# Patient Record
Sex: Female | Born: 1995 | Race: White | Hispanic: No | Marital: Single | Smoking: Never smoker
Health system: Southern US, Community
[De-identification: ages and names within clinical notes are randomized; demographics above are authoritative.]

## PROBLEM LIST (undated history)

## (undated) DIAGNOSIS — A6000 Herpesviral infection of urogenital system, unspecified: Secondary | ICD-10-CM

## (undated) DIAGNOSIS — L509 Urticaria, unspecified: Secondary | ICD-10-CM

## (undated) HISTORY — PX: OTHER SURGICAL HISTORY: SHX169

## (undated) HISTORY — DX: Herpesviral infection of urogenital system, unspecified: A60.00

## (undated) HISTORY — DX: Urticaria, unspecified: L50.9

## (undated) HISTORY — PX: TONSILLECTOMY AND ADENOIDECTOMY: SUR1326

## (undated) HISTORY — PX: FOOT SURGERY: SHX648

---
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
## 2015-04-02 ENCOUNTER — Encounter: Payer: Self-pay | Admitting: Primary Care

## 2015-04-02 ENCOUNTER — Ambulatory Visit (INDEPENDENT_AMBULATORY_CARE_PROVIDER_SITE_OTHER): Payer: 59 | Admitting: Primary Care

## 2015-04-02 VITALS — BP 114/66 | HR 127 | Temp 100.1°F | Ht 62.75 in | Wt 125.8 lb

## 2015-04-02 DIAGNOSIS — R509 Fever, unspecified: Secondary | ICD-10-CM

## 2015-04-02 LAB — CBC WITH DIFFERENTIAL/PLATELET
BASOS PCT: 0.3 % (ref 0.0–3.0)
Basophils Absolute: 0 10*3/uL (ref 0.0–0.1)
EOS ABS: 0 10*3/uL (ref 0.0–0.7)
Eosinophils Relative: 0.2 % (ref 0.0–5.0)
HCT: 38.2 % (ref 36.0–49.0)
Hemoglobin: 12.8 g/dL (ref 12.0–16.0)
LYMPHS ABS: 1.5 10*3/uL (ref 0.7–4.0)
Lymphocytes Relative: 14.4 % — ABNORMAL LOW (ref 24.0–48.0)
MCHC: 33.4 g/dL (ref 31.0–37.0)
MCV: 85.4 fl (ref 78.0–98.0)
MONO ABS: 1 10*3/uL (ref 0.1–1.0)
Monocytes Relative: 10.1 % (ref 3.0–12.0)
NEUTROS ABS: 7.7 10*3/uL (ref 1.4–7.7)
Neutrophils Relative %: 75 % — ABNORMAL HIGH (ref 43.0–71.0)
PLATELETS: 185 10*3/uL (ref 150.0–575.0)
RBC: 4.47 Mil/uL (ref 3.80–5.70)
RDW: 13 % (ref 11.4–15.5)
WBC: 10.2 10*3/uL (ref 4.5–13.5)

## 2015-04-02 LAB — COMPREHENSIVE METABOLIC PANEL
ALT: 14 U/L (ref 0–35)
AST: 17 U/L (ref 0–37)
Albumin: 3.9 g/dL (ref 3.5–5.2)
Alkaline Phosphatase: 63 U/L (ref 47–119)
BUN: 9 mg/dL (ref 6–23)
CHLORIDE: 103 meq/L (ref 96–112)
CO2: 24 mEq/L (ref 19–32)
CREATININE: 0.61 mg/dL (ref 0.40–1.20)
Calcium: 8.9 mg/dL (ref 8.4–10.5)
GFR: 133.45 mL/min (ref >60.00)
Glucose, Bld: 95 mg/dL (ref 70–99)
Potassium: 4.1 mEq/L (ref 3.5–5.1)
SODIUM: 134 meq/L — AB (ref 135–145)
Total Bilirubin: 0.3 mg/dL (ref 0.2–1.2)
Total Protein: 6.7 g/dL (ref 6.0–8.3)

## 2015-04-02 LAB — POCT RAPID STREP A (OFFICE): RAPID STREP A SCREEN: NEGATIVE

## 2015-04-02 LAB — MONONUCLEOSIS SCREEN: Mono Screen: NEGATIVE

## 2015-04-02 LAB — POCT INFLUENZA A/B
INFLUENZA B, POC: NEGATIVE
Influenza A, POC: NEGATIVE

## 2015-04-02 NOTE — Patient Instructions (Signed)
Your flu and strep tests were negative.  Complete lab work prior to leaving today. I will notify you of your results once received.   It's important for you to stay hydrated with water. Drink 8 bottles of water daily, or 64 ounces.   Continue tylenol or ibuprofen for fevers and body aches. Do not exceed 3000 mg of tylenol or 2400 mg of ibuprofen in 24 hours.  Please call me if you develop fevers over 101, coughing up green mucous, vomiting/diarrhea.  I will be in touch with you soon.  It was a pleasure meeting you!

## 2015-04-02 NOTE — Progress Notes (Signed)
Subjective:    Patient ID: Jaclyn Bell, female    DOB: 1995/08/12, 20 y.o.   MRN: [id number]  HPI  Jaclyn Bell is a 20 year old female who presents today with a chief complaint of sore throat. She also reports fevers, chills, night sweats, headaches, body aches. She has a fever of 100.1 in the clinic today with a heart rate of 127. Her symptoms have been present since Monday this week, with worsening symptoms yesterday. She endorses having a flu shot this season. She's taken Aleve cold and sinus with temporary improvement in her sleep. She's been around her boyfriend who had strep throat 2 weeks ago.   Review of Systems  Constitutional: Positive for fever, chills and fatigue.  HENT: Positive for sore throat.   Respiratory: Negative for cough and shortness of breath.   Cardiovascular: Negative for chest pain.  Gastrointestinal: Negative for nausea, vomiting and diarrhea.  Musculoskeletal: Positive for myalgias.  Neurological: Positive for headaches.       No past medical history on file.  Social History   Social History  . Marital Status: Single    Spouse Name: N/A  . Number of Children: N/A  . Years of Education: N/A   Occupational History  . Not on file.   Social History Main Topics  . Smoking status: Never Smoker   . Smokeless tobacco: Never Used  . Alcohol Use: No  . Drug Use: No  . Sexual Activity: Not on file   Other Topics Concern  . Not on file   Social History Narrative   Working for [employer] and [employer]    Past Surgical History  Procedure Laterality Date  . Tonsillectomy and adenoidectomy    . Tubes in ears Bilateral     No family history on file.  No Known Allergies  Current Outpatient Prescriptions on File Prior to Visit  Medication Sig Dispense Refill  . Norgestimate-Ethinyl Estradiol Triphasic 0.18/0.215/0.25 MG-35 MCG tablet Take 1 tablet by mouth daily. 28 tablet 11   No current facility-administered medications on file prior  to visit.    BP 114/66 mmHg  Pulse 127  Temp(Src) 100.1 F (37.8 C) (Oral)  Ht 5' 2.75" (1.594 m)  Wt 125 lb 12.8 oz (57.063 kg)  BMI 22.46 kg/m2  SpO2 96%  LMP 03/11/2015    Objective:   Physical Exam  Constitutional: She appears ill.  HENT:  Right Ear: Tympanic membrane and ear canal normal.  Left Ear: Tympanic membrane and ear canal normal.  Nose: Right sinus exhibits maxillary sinus tenderness. Right sinus exhibits no frontal sinus tenderness. Left sinus exhibits maxillary sinus tenderness. Left sinus exhibits no frontal sinus tenderness.  Mouth/Throat: Oropharynx is clear and moist. No posterior oropharyngeal edema or posterior oropharyngeal erythema.  Eyes: Conjunctivae are normal.  Cardiovascular: Regular rhythm.   Sinus tachycardia  Pulmonary/Chest: Effort normal and breath sounds normal. She has no wheezes. She has no rales.  Lymphadenopathy:    She has cervical adenopathy.  Skin: Skin is warm and dry.          Assessment & Plan:  Flu-Like Symptoms:  Fever, chills, body aches, headache since Monday, significantly worse Tuesday.  No N/V/D. Minor cough. Exam with clear lungs, patient appears ill, fever of 100.0 with sinus tachycardia. Rapid Flu: Negative Rapid Strep: Negative HPI and examination suggest viral involvement so will given age, will check labs including mono, CBC, CMP. Fluids, tylenol/ibuprofen, rest. Will be in touch with her later today. ED/retrun precautions  provided.

## 2015-04-02 NOTE — Progress Notes (Signed)
Pre visit review using our clinic review tool, if applicable. No additional management support is needed unless otherwise documented below in the visit note. 

## 2015-04-02 NOTE — Addendum Note (Signed)
Addended by: Jacqualin Combes on: 04/02/2015 01:03 PM   Modules accepted: Orders, SmartSet

## 2015-04-04 ENCOUNTER — Telehealth: Payer: Self-pay | Admitting: Primary Care

## 2015-04-04 NOTE — Telephone Encounter (Signed)
Called patient. She stated that she is feeling better. No fever today. Throat is a little sore and ears are popping when swallowing. There is some neck pain but that can be due to the way she has been sleeping.

## 2015-04-04 NOTE — Telephone Encounter (Signed)
Will you please call and check on Jaclyn Bell? How's she feeling?

## 2015-04-04 NOTE — Telephone Encounter (Signed)
Noted. Thanks.

## 2015-05-15 ENCOUNTER — Telehealth: Payer: Self-pay | Admitting: *Deleted

## 2015-05-15 NOTE — Telephone Encounter (Signed)
Please review her immunizations in [facility].

## 2015-05-15 NOTE — Telephone Encounter (Signed)
Spoke to pt and informed her immunization record is available for pickup from the front desk. TB skin test scheduled and pt advised she may receive documentation after having it read

## 2015-05-15 NOTE — Telephone Encounter (Signed)
Pt left voicemail at Triage. Pt is entering in the dental assistant program at [employer] and she needs proof that her vaccines are up to date or if there is one she needs to get. Pt either needs a copy of her immunizations if she is up to date and if not she needs to schedule a visit to get her immunizations up to date

## 2015-05-19 ENCOUNTER — Ambulatory Visit (INDEPENDENT_AMBULATORY_CARE_PROVIDER_SITE_OTHER): Payer: 59 | Admitting: *Deleted

## 2015-05-19 DIAGNOSIS — Z23 Encounter for immunization: Secondary | ICD-10-CM

## 2015-05-19 MED ORDER — TUBERCULIN PPD 5 UNIT/0.1ML ID SOLN: 5.0000 [IU] | Freq: Once | INTRADERMAL | Status: DC

## 2015-05-20 ENCOUNTER — Ambulatory Visit: Payer: 59

## 2015-05-21 LAB — TB SKIN TEST
Induration: 0 mm
TB SKIN TEST: NEGATIVE

## 2015-11-18 ENCOUNTER — Other Ambulatory Visit: Payer: Self-pay | Admitting: Family Medicine

## 2015-11-20 ENCOUNTER — Telehealth: Payer: Self-pay | Admitting: Family Medicine

## 2015-11-20 NOTE — Telephone Encounter (Signed)
We will be in clinic on Thursday. Jaclyn Bell may be able to give it, but if not she may have to come when the flu clinic is open

## 2015-11-20 NOTE — Telephone Encounter (Signed)
Pt called to get flu shot next week would like thurs  Can I put her in

## 2015-11-21 NOTE — Telephone Encounter (Signed)
Pt made appointment to see dr Deborra Medina 10/12

## 2015-11-21 NOTE — Telephone Encounter (Signed)
I will be on vacation next week; however, we do have flu clinics opening up next week all day on Tuesday and Friday, if she could make either of those days.  Thanks Shirlean Mylar for following up with her on this.

## 2015-11-27 ENCOUNTER — Encounter (INDEPENDENT_AMBULATORY_CARE_PROVIDER_SITE_OTHER): Payer: Self-pay

## 2015-11-27 ENCOUNTER — Ambulatory Visit: Payer: 59 | Admitting: Family Medicine

## 2015-11-27 ENCOUNTER — Ambulatory Visit (INDEPENDENT_AMBULATORY_CARE_PROVIDER_SITE_OTHER): Payer: 59

## 2015-11-27 DIAGNOSIS — Z309 Encounter for contraceptive management, unspecified: Secondary | ICD-10-CM | POA: Insufficient documentation

## 2015-11-27 DIAGNOSIS — Z23 Encounter for immunization: Secondary | ICD-10-CM

## 2015-12-09 ENCOUNTER — Ambulatory Visit (INDEPENDENT_AMBULATORY_CARE_PROVIDER_SITE_OTHER): Payer: 59 | Admitting: Family Medicine

## 2015-12-09 ENCOUNTER — Encounter: Payer: Self-pay | Admitting: Family Medicine

## 2015-12-09 VITALS — BP 102/70 | HR 79 | Temp 98.0°F | Ht 63.0 in | Wt 130.5 lb

## 2015-12-09 DIAGNOSIS — Z01419 Encounter for gynecological examination (general) (routine) without abnormal findings: Secondary | ICD-10-CM | POA: Diagnosis not present

## 2015-12-09 DIAGNOSIS — Z309 Encounter for contraceptive management, unspecified: Secondary | ICD-10-CM | POA: Diagnosis not present

## 2015-12-09 MED ORDER — DROSPIRENONE-ETHINYL ESTRADIOL 3-0.02 MG PO TABS: 1.0000 | ORAL_TABLET | Freq: Every day | ORAL | 11 refills | Status: DC

## 2015-12-09 NOTE — Progress Notes (Signed)
Subjective:   Patient ID: Jaclyn Bell, female    DOB: 1996-01-08, 20 y.o.   MRN: [id number]  Jaclyn Bell is a pleasant 20 y.o. year old female who presents to clinic today with Annual Exam and Contraception (wanting to change pill type)  on 12/09/2015  HPI:   On OCPs.  She would like to discuss other pills.  Having more mood swings, periods are heavy again.  Previously tried loestrin.  Currently taking Tri sprintec.  Acne improved as well.  Not sexually active.  Lab Results  Component Value Date   WBC 10.2 04/02/2015   HGB 12.8 04/02/2015   HCT 38.2 04/02/2015   MCV 85.4 04/02/2015   PLT 185.0 04/02/2015   Lab Results  Component Value Date   NA 134 (L) 04/02/2015   K 4.1 04/02/2015   CL 103 04/02/2015   CO2 24 04/02/2015   Lab Results  Component Value Date   CHOL 142 11/22/2012   HDL 50 11/22/2012   LDLCALC 83 11/22/2012   TRIG 47 11/22/2012   Lab Results  Component Value Date   CREATININE 0.61 04/02/2015    No current outpatient prescriptions on file prior to visit.   No current facility-administered medications on file prior to visit.     No Known Allergies  No past medical history on file.  Past Surgical History:  Procedure Laterality Date  . TONSILLECTOMY AND ADENOIDECTOMY    . TUBES IN EARS Bilateral     No family history on file.  Social History   Social History  . Marital status: Single    Spouse name: N/A  . Number of children: N/A  . Years of education: N/A   Occupational History  . Not on file.   Social History Main Topics  . Smoking status: Never Smoker  . Smokeless tobacco: Never Used  . Alcohol use No  . Drug use: No  . Sexual activity: Not on file   Other Topics Concern  . Not on file   Social History Narrative   Working for [employer] and [employer]   The PMH, PSH, Social History, Family History, Medications, and allergies have been reviewed in [facility], and have been updated if relevant.   Review of  Systems  Constitutional: Negative.   HENT: Negative.   Respiratory: Negative.   Cardiovascular: Negative.   Gastrointestinal: Negative.   Endocrine: Negative.   Genitourinary: Negative.   Musculoskeletal: Negative.   Skin: Negative.   Allergic/Immunologic: Negative.   Neurological: Negative.   Hematological: Negative.   Psychiatric/Behavioral: Negative.   All other systems reviewed and are negative.  See HPI     Objective:    BP 102/70   Pulse 79   Temp 98 F (36.7 C) (Oral)   Ht 5\' 3"  (1.6 m)   Wt 130 lb 8 oz (59.2 kg)   SpO2 98%   BMI 23.12 kg/m    Physical Exam    General:  Well-developed,well-nourished,in no acute distress; alert,appropriate and cooperative throughout examination Head:  normocephalic and atraumatic.   Eyes:  vision grossly intact, pupils equal, pupils round, and pupils reactive to light.   Ears:  R ear normal and L ear normal.   Nose:  no external deformity.   Mouth:  good dentition.   Neck:  No deformities, masses, or tenderness noted.   Lungs:  Normal respiratory effort, chest expands symmetrically. Lungs are clear to auscultation, no crackles or wheezes. Heart:  Normal rate and regular rhythm. S1 and  S2 normal without gallop, murmur, click, rub or other extra sounds. Abdomen:  Bowel sounds positive,abdomen soft and non-tender without masses, organomegaly or hernias noted. Msk:  No deformity or scoliosis noted of thoracic or lumbar spine.   Extremities:  No clubbing, cyanosis, edema, or deformity noted with normal full range of motion of all joints.   Neurologic:  alert & oriented X3 and gait normal.   Skin:  Intact without suspicious lesions or rashes Cervical Nodes:  No lymphadenopathy noted Axillary Nodes:  No palpable lymphadenopathy Psych:  Cognition and judgment appear intact. Alert and cooperative with normal attention span and concentration. No apparent delusions, illusions, hallucinations      Assessment & Plan:   Encounter for  contraceptive management, unspecified type  Well woman exam No Follow-up on file.

## 2015-12-09 NOTE — Assessment & Plan Note (Signed)
Reviewed preventive care protocols, scheduled due services, and updated immunizations Discussed nutrition, exercise, diet, and healthy lifestyle.  

## 2015-12-09 NOTE — Patient Instructions (Signed)
Great to see you.  We are starting Yaz.  Keep me updated.

## 2015-12-09 NOTE — Assessment & Plan Note (Signed)
Will d/c tri sprintec.  Start yaz. She will keep me updated.

## 2015-12-09 NOTE — Progress Notes (Signed)
Pre visit review using our clinic review tool, if applicable. No additional management support is needed unless otherwise documented below in the visit note. 

## 2015-12-18 ENCOUNTER — Other Ambulatory Visit: Payer: Self-pay | Admitting: Family Medicine

## 2015-12-19 NOTE — Addendum Note (Signed)
Addended by: Helene Shoe on: 12/19/2015 02:17 PM   Modules accepted: Orders

## 2015-12-19 NOTE — Telephone Encounter (Signed)
Pt called to find out why tri sprintec was refilled when pt was changed to yaz when seen on 12/09/15 visit. Per 12/09/15 office note pt was changed to yaz; [facility] had requested refill tri sprintec. I spoke with Ameal at [facility] and he will d/c rx for tri sprintec. Pt did not pick up tri spintec. I notified pt she should be taking yaz and will take tri sprintec off current med list. Pt voiced understanding.

## 2016-03-29 ENCOUNTER — Ambulatory Visit: Payer: 59 | Admitting: Family Medicine

## 2016-03-29 ENCOUNTER — Ambulatory Visit (INDEPENDENT_AMBULATORY_CARE_PROVIDER_SITE_OTHER): Payer: 59 | Admitting: *Deleted

## 2016-03-29 DIAGNOSIS — Z23 Encounter for immunization: Secondary | ICD-10-CM

## 2016-05-12 ENCOUNTER — Telehealth: Payer: Self-pay

## 2016-05-12 NOTE — Telephone Encounter (Signed)
Pt left v/m; pt got needle stick at dental office and pt got card from [facility] walk in that pt had missed appt. Pt wants to know if the appt pt has with Dr Deborra Medina May 2.2018 is the follow up appt. Pt request cb.

## 2016-05-12 NOTE — Telephone Encounter (Signed)
Left a message to call office. It looks like the May appt is a follow-up. It says f/u bloodwork.

## 2016-05-12 NOTE — Telephone Encounter (Signed)
Jaclyn Bell, can you help with this?  I am not quite sure what she is asking.

## 2016-06-16 ENCOUNTER — Other Ambulatory Visit: Payer: Self-pay | Admitting: Family Medicine

## 2016-06-16 ENCOUNTER — Other Ambulatory Visit (INDEPENDENT_AMBULATORY_CARE_PROVIDER_SITE_OTHER): Payer: 59

## 2016-06-16 ENCOUNTER — Other Ambulatory Visit: Payer: 59

## 2016-06-16 ENCOUNTER — Encounter (INDEPENDENT_AMBULATORY_CARE_PROVIDER_SITE_OTHER): Payer: Self-pay

## 2016-06-16 DIAGNOSIS — W460XXA Contact with hypodermic needle, initial encounter: Secondary | ICD-10-CM

## 2016-06-18 LAB — HIV ANTIBODY (ROUTINE TESTING W REFLEX): HIV SCREEN 4TH GENERATION: NONREACTIVE

## 2016-06-18 LAB — HEPATITIS B SURFACE ANTIBODY, QUANTITATIVE: Hepatitis B Surf Ab Quant: >1000 m[IU]/mL (ref >9.9)

## 2016-08-26 ENCOUNTER — Ambulatory Visit (INDEPENDENT_AMBULATORY_CARE_PROVIDER_SITE_OTHER): Payer: 59 | Admitting: *Deleted

## 2016-08-26 ENCOUNTER — Telehealth: Payer: Self-pay

## 2016-08-26 DIAGNOSIS — Z23 Encounter for immunization: Secondary | ICD-10-CM | POA: Diagnosis not present

## 2016-08-26 NOTE — Telephone Encounter (Signed)
Scheduled TDAP FOR TODAY AT 11AM.

## 2016-10-26 ENCOUNTER — Telehealth: Payer: Self-pay | Admitting: Family Medicine

## 2016-10-26 NOTE — Telephone Encounter (Signed)
Patient needs proof she had a tb test done and her immunization records.  Please let patient know when it's ready for pick up.

## 2016-10-26 NOTE — Telephone Encounter (Signed)
Results placed in yellow folder along with her immunization record. Pt aware

## 2016-10-28 NOTE — Telephone Encounter (Signed)
Pt called to give permission to Teena Dunk to pick up paperwork

## 2016-11-01 ENCOUNTER — Other Ambulatory Visit: Payer: Self-pay | Admitting: Family Medicine

## 2016-12-10 ENCOUNTER — Encounter: Payer: 59 | Admitting: Family Medicine

## 2016-12-24 ENCOUNTER — Encounter: Payer: Self-pay | Admitting: Family Medicine

## 2016-12-24 ENCOUNTER — Encounter: Payer: 59 | Admitting: Family Medicine

## 2016-12-24 ENCOUNTER — Ambulatory Visit: Payer: 59 | Admitting: Family Medicine

## 2016-12-24 VITALS — BP 114/78 | HR 85 | Temp 97.9°F | Ht 62.0 in | Wt 133.8 lb

## 2016-12-24 DIAGNOSIS — N946 Dysmenorrhea, unspecified: Secondary | ICD-10-CM | POA: Diagnosis not present

## 2016-12-24 DIAGNOSIS — Z0000 Encounter for general adult medical examination without abnormal findings: Secondary | ICD-10-CM

## 2016-12-24 MED ORDER — ETHYNODIOL DIAC-ETH ESTRADIOL 1-35 MG-MCG PO TABS: 1.0000 | ORAL_TABLET | Freq: Every day | ORAL | 4 refills | Status: DC

## 2016-12-24 NOTE — Progress Notes (Signed)
Subjective:    Patient ID: Jaclyn Bell, female    DOB: 1995/10/11, 21 y.o.   MRN: [id number]  HPI This is a 21 yo female who presents today for annual exam. 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 Lives with her mother and her 24 yo sister. Shops for fun.   She is having breakthrough bleeding, always the week before her period. Dr. Deborra Medina changed her OCP last year and she did ok for several months but then started having breakthrough bleeding again. Occasional cramping day before and day of. Periods 4-6 days.   Has been having more headaches at base of neck and behind eyes. She takes ibuprofen/alleve/excedrin with full relief. Headaches 1-4 times a week. Not always sensitive to light/sound. Never any aura with headaches. Always able to get relief.   Last CPE- 10/17 Pap-NA Tdap- 08/26/16 Flu-annual Eye- annual Dental- regular Exercise- not regular Sex- has not been in relationship for over a year, not currently dating. Has been screened for STD in past. Declines screening today.  No past medical history on file. Past Surgical History:  Procedure Laterality Date  . TONSILLECTOMY AND ADENOIDECTOMY    . TUBES IN EARS Bilateral    Family History  Problem Relation Age of Onset  . Hypertension Mother   . Mental illness Father   . Cancer Maternal Grandmother    Social History   Tobacco Use  . Smoking status: Never Smoker  . Smokeless tobacco: Never Used  Substance Use Topics  . Alcohol use: No  . Drug use: No      Review of Systems  Constitutional: Negative for fatigue, fever and unexpected weight change.  HENT: Positive for rhinorrhea (occasional, relieved wtih OTC antihistmine.).   Eyes: Negative.   Respiratory: Negative for cough.   Cardiovascular: Negative for chest pain and leg swelling.  Gastrointestinal: Negative for abdominal pain, constipation and diarrhea.  Genitourinary: Positive for menstrual problem (breakthrough bleeding). Negative for dysuria and hematuria.    Musculoskeletal: Negative.   Skin: Negative.   Allergic/Immunologic: Positive for environmental allergies (seasonal).  Neurological: Positive for headaches.  Psychiatric/Behavioral: Negative for dysphoric mood and sleep disturbance. The patient is not nervous/anxious.        Objective:   Physical Exam Physical Exam  Constitutional: She is oriented to person, place, and time. She appears well-developed and well-nourished. No distress.  HENT:  Head: Normocephalic and atraumatic.  Right Ear: External ear normal.  Left Ear: External ear normal.  Nose: Nose normal.  Mouth/Throat: Oropharynx is clear and moist. No oropharyngeal exudate.  Eyes: Conjunctivae are normal. Pupils are equal, round, and reactive to light.  Neck: Normal range of motion. Neck supple.No thyromegaly present.  Cardiovascular: Normal rate, regular rhythm, normal heart sounds and intact distal pulses.   Pulmonary/Chest: Effort normal and breath sounds normal. Abdominal: Soft. Bowel sounds are normal. She exhibits no distension and no mass. There is no tenderness. There is no rebound and no guarding.  Musculoskeletal: Normal range of motion. She exhibits no edema or tenderness.  Lymphadenopathy:    She has no cervical adenopathy.  Neurological: She is alert and oriented to person, place, and time. She has normal reflexes.  Skin: Skin is warm and dry. She is not diaphoretic.  Psychiatric: She has a normal mood and affect. Her behavior is normal. Judgment and thought content normal.  Vitals reviewed.     BP 114/78 (BP Location: Left Arm, Patient Position: Sitting, Cuff Size: Normal)   Pulse 85   Temp 97.9  F (36.6 C) (Oral)   Ht 5\' 2"  (1.575 m)   Wt 133 lb 12 oz (60.7 kg)   LMP 11/29/2016   SpO2 99%   BMI 24.46 kg/m  Wt Readings from Last 3 Encounters:  12/24/16 133 lb 12 oz (60.7 kg)  12/09/15 130 lb 8 oz (59.2 kg)  04/02/15 125 lb 12.8 oz (57.1 kg) (46 %, Z= -0.09)*   * Growth percentiles are based on  CDC (Girls, 2-20 Years) data.   Depression screen PHQ 2/9 12/24/2016  Decreased Interest 0  Down, Depressed, Hopeless 0  PHQ - 2 Score 0       Assessment & Plan:  1. Annual physical exam -- Discussed and encouraged healthy lifestyle choices- adequate sleep, regular exercise, stress management and healthy food choices.  - follow up in 1 year  2. Dysmenorrhea - will try different OCP, discussed expectations of treatment - ethynodiol-ethinyl estradiol (KELNOR,ZOVIA) 1-35 MG-MCG tablet; Take 1 tablet daily by mouth.  Dispense: 3 Package; Refill: [place], FNP-BC   [facility]  12/26/2016 11:05 AM

## 2016-12-24 NOTE — Patient Instructions (Signed)
It was great to see you today  I have sent in a different OCP to your pharmacy  Keeping You Healthy  Get These Tests 1. Blood Pressure- Have your blood pressure checked once a year by your health care provider.  Normal blood pressure is 120/80. 2. Weight- Have your body mass index (BMI) calculated to screen for obesity.  BMI is measure of body fat based on height and weight.  You can also calculate your own BMI at GravelBags.it. 3. Cholesterol- Have your cholesterol checked every 5 years starting at age 21 then yearly starting at age 21. 29. Chlamydia, HIV, and other sexually transmitted diseases- Get screened every year until age 21, then within three months of each new sexual provider. 5. Pap Test - Every 1-5 years; discuss with your health care provider. 6. Mammogram- Every 1-2 years starting at age 21--50  Take these medicines  Calcium with Vitamin D-Your body needs 1200 mg of Calcium each day and [phone] IU of Vitamin D daily.  Your body can only absorb 500 mg of Calcium at a time so Calcium must be taken in 2 or 3 divided doses throughout the day.  Multivitamin with folic acid- Once daily if it is possible for you to become pregnant.  Get these Immunizations  Gardasil-Series of three doses; prevents HPV related illness such as genital warts and cervical cancer.  Menactra-Single dose; prevents meningitis.  Tetanus shot- Every 10 years.  Flu shot-Every year.  Take these steps 1. Do not smoke-Your healthcare provider can help you quit.  For tips on how to quit go to www.smokefree.gov or call 1-800 QUITNOW. 2. Be physically active- Exercise 5 days a week for at least 30 minutes.  If you are not already physically active, start slow and gradually work up to 30 minutes of moderate physical activity.  Examples of moderate activity include walking briskly, dancing, swimming, bicycling, etc. 3. Breast Cancer- A self breast exam every month is important for early detection of  breast cancer.  For more information and instruction on self breast exams, ask your healthcare provider or https://www.patel.info/. 4. Eat a healthy diet- Eat a variety of healthy foods such as fruits, vegetables, whole grains, low fat milk, low fat cheeses, yogurt, lean meats, poultry and fish, beans, nuts, tofu, etc.  For more information go to www. Thenutritionsource.org 5. Drink alcohol in moderation- Limit alcohol intake to one drink or less per day. Never drink and drive. 6. Depression- Your emotional health is as important as your physical health.  If you're feeling down or losing interest in things you normally enjoy please talk to your healthcare provider about being screened for depression. 7. Dental visit- Brush and floss your teeth twice daily; visit your dentist twice a year. 8. Eye doctor- Get an eye exam at least every 2 years. 9. Helmet use- Always wear a helmet when riding a bicycle, motorcycle, rollerblading or skateboarding. 14. Safe sex- If you may be exposed to sexually transmitted infections, use a condom. 11. Seat belts- Seat belts can save your live; always wear one. 12. Smoke/Carbon Monoxide detectors- These detectors need to be installed on the appropriate level of your home. Replace batteries at least once a year. 13. Skin cancer- When out in the sun please cover up and use sunscreen 15 SPF or higher. 14. Violence- If anyone is threatening or hurting you, please tell your healthcare provider.

## 2016-12-26 ENCOUNTER — Other Ambulatory Visit: Payer: Self-pay | Admitting: Family Medicine

## 2016-12-26 ENCOUNTER — Encounter: Payer: Self-pay | Admitting: Family Medicine

## 2016-12-27 NOTE — Telephone Encounter (Signed)
Was started on different BCP/thx dmf

## 2016-12-28 ENCOUNTER — Encounter: Payer: 59 | Admitting: Family Medicine

## 2017-03-18 ENCOUNTER — Ambulatory Visit (INDEPENDENT_AMBULATORY_CARE_PROVIDER_SITE_OTHER): Payer: Managed Care, Other (non HMO) | Admitting: Advanced Practice Midwife

## 2017-03-18 ENCOUNTER — Encounter: Payer: Self-pay | Admitting: Advanced Practice Midwife

## 2017-03-18 VITALS — BP 120/80 | Ht 63.0 in | Wt 138.0 lb

## 2017-03-18 DIAGNOSIS — Z124 Encounter for screening for malignant neoplasm of cervix: Secondary | ICD-10-CM | POA: Diagnosis not present

## 2017-03-18 DIAGNOSIS — A6000 Herpesviral infection of urogenital system, unspecified: Secondary | ICD-10-CM | POA: Insufficient documentation

## 2017-03-18 DIAGNOSIS — Z113 Encounter for screening for infections with a predominantly sexual mode of transmission: Secondary | ICD-10-CM | POA: Diagnosis not present

## 2017-03-18 HISTORY — DX: Herpesviral infection of urogenital system, unspecified: A60.00

## 2017-03-18 NOTE — Progress Notes (Signed)
S: The patient is here today with complaint of vulvar pain and white spots on her external genitals. She had intercourse with her boyfriend of 2 months on Saturday last week. They used a condom and usually do use condoms. On Monday 4 days ago she began to have vaginal soreness and swelling. She noticed some discharge and also burning with urination. She suspected a yeast infection since she has had a couple of yeast infections in the past 2 months, both times following antibiotic use for sinus infection/other infection. She was prescribed Diflucan and has used it with good results. She most recently used Diflucan on Wednesday of this week. In the last couple of days she has had an increase in vaginal pain and she also noticed some "white spots" that appear to be pimples or blisters. The spots are at the top and go down along both sides of the vulva/labia. The patient denies any changes in body care products. She is unsure if she has an allergy to latex or if the condoms she uses are latex. Discussion of HSV infection, s/s, treatments.  O: Vital Signs: BP 120/80   Ht 5\' 3"  (1.6 m)   Wt 138 lb (62.6 kg)   LMP 02/24/2017   BMI 24.45 kg/m  Constitutional: Well nourished, well developed female in no acute distress.  HEENT: normal Skin: Warm and dry.  Cardiovascular: Regular rate and rhythm.   Extremity: no edema  Respiratory: Clear to auscultation bilateral. Normal respiratory effort Psych: Alert and Oriented x3. No memory deficits. Normal mood and affect.  MS: normal gait, normal bilateral lower extremity ROM/strength/stability.  Pelvic exam:  is not limited by body habitus EGBUS: a number of blister like spots with ulcerated centers on the hood and along labia and spreading out to vulva Vagina: within normal limits and with normal mucosa blood, scant thin white discharge Cervix: columnar epithelium present  A: 22 yo female with vaginal pain, ulcerated lesions of external genitalia, possible HSV  infection vs contact dermatitis  P: HSV culture PAPtima Avoid sexual activity until labs result Return to clinic PRN  Rod Can, CNM

## 2017-03-18 NOTE — Patient Instructions (Signed)

## 2017-03-21 ENCOUNTER — Telehealth: Payer: Self-pay

## 2017-03-21 LAB — HERPES SIMPLEX VIRUS CULTURE

## 2017-03-21 NOTE — Telephone Encounter (Signed)
Pt was seen Fri, please call c results, leave vm, put them on MyChart, or call between 1-2 M-F.  [phone]  Left detailed msg pap wouldn't be back for 7-10 business days and the culture would take probably even longer.  To keep watch on MyChart.

## 2017-03-22 LAB — IGP,CTNGTV,RFX APTIMA HPV ASCU
Chlamydia, Nuc. Acid Amp: NEGATIVE
Gonococcus, Nuc. Acid Amp: NEGATIVE
PAP SMEAR COMMENT: 0
TRICH VAG BY NAA: NEGATIVE

## 2017-03-24 ENCOUNTER — Telehealth: Payer: Self-pay | Admitting: Obstetrics and Gynecology

## 2017-03-24 DIAGNOSIS — A6004 Herpesviral vulvovaginitis: Secondary | ICD-10-CM

## 2017-03-24 MED ORDER — VALACYCLOVIR HCL 1 G PO TABS: 1000.0000 mg | ORAL_TABLET | Freq: Two times a day (BID) | ORAL | 0 refills | Status: AC

## 2017-03-24 NOTE — Telephone Encounter (Signed)
Discussed result of positive HSV culture. Sent in initial treatment medication. Discussed permanent nature of the infection and signs and symptoms of an outbreak. All questions answered.

## 2017-04-11 ENCOUNTER — Encounter: Payer: Self-pay | Admitting: Advanced Practice Midwife

## 2017-06-24 ENCOUNTER — Telehealth: Payer: Self-pay

## 2017-06-24 NOTE — Telephone Encounter (Signed)
Called and spoke with patient. Patient requesting a copy of immunizations. Informed patient that a copy was printed and placed upfront for pick up. Understanding verbalized nothing further needed at this time.

## 2017-06-24 NOTE — Telephone Encounter (Signed)
Copied from [place] [id number]. Topic: General - Other >> Jun 24, 2017  2:18 PM Boyd Kerbs wrote: Pt. Needing Hep. B shots for work.    Pt would like to pick up Friday 5/17 by noon   If call may leave a message

## 2017-07-01 NOTE — Telephone Encounter (Signed)
Pt came in to pick up Hep B titer; pt wants to know if she will need to take the Hep B series. Pt said she had Hep B vaccine as a child and one Hep B vaccine 03/29/16. Glenda Chroman FNP said with Hep B immunity pt should ck with employer to see if they require Hep B series to be done or will they accept the Hep B titer results. Pt voiced understanding and will cb if nurse visits for Hep B needed.

## 2017-09-16 ENCOUNTER — Encounter: Payer: Self-pay | Admitting: Podiatry

## 2017-09-16 ENCOUNTER — Ambulatory Visit: Payer: Managed Care, Other (non HMO) | Admitting: Podiatry

## 2017-09-16 DIAGNOSIS — M7751 Other enthesopathy of right foot: Secondary | ICD-10-CM

## 2017-09-16 DIAGNOSIS — M779 Enthesopathy, unspecified: Secondary | ICD-10-CM | POA: Diagnosis not present

## 2017-09-16 DIAGNOSIS — L989 Disorder of the skin and subcutaneous tissue, unspecified: Secondary | ICD-10-CM | POA: Diagnosis not present

## 2017-09-16 DIAGNOSIS — M7752 Other enthesopathy of left foot: Secondary | ICD-10-CM

## 2017-09-20 NOTE — Progress Notes (Signed)
   Subjective: 22 year old female presenting today as a new patient with a chief complaint of painful callus lesions to the bilateral feet that have been present for the past several months. Walking increases the pain. She has not done anything for treatment. Patient is here for further evaluation and treatment.   No past medical history on file.   Objective:  Physical Exam General: Alert and oriented x3 in no acute distress  Dermatology: Hyperkeratotic lesion present on the bilateral sub-fifth MPJs. Pain on palpation with a central nucleated core noted. Skin is warm, dry and supple bilateral lower extremities. Negative for open lesions or macerations.  Vascular: Palpable pedal pulses bilaterally. No edema or erythema noted. Capillary refill within normal limits.  Neurological: Epicritic and protective threshold grossly intact bilaterally.   Musculoskeletal Exam: Pain on palpation at the keratotic lesion noted as well as the 5th MPJs bilaterally. Range of motion within normal limits bilateral. Muscle strength 5/5 in all groups bilateral.  Assessment: 1. Pre-ulcerative callus lesion noted to the bilateral sub-fifth MPJs 2. 5th MPJ capsulitis bilateral   Plan of Care:  1. Patient evaluated 2. Excisional debridement of keratoic lesions using a chisel blade was performed without incident.  3. Dressed area with light dressing. 4. Pre-authorization for custom molded orthotics sent to [facility].  5. Recommended good shoe gear.  6. Patient is to return to the clinic PRN.   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   Edrick Kins, DPM [facility]  Dr. Edrick Kins, [place]                                        [place], [place] [address]                Office [phone]  Fax [phone]

## 2017-09-22 ENCOUNTER — Telehealth: Payer: Self-pay | Admitting: Podiatry

## 2017-09-22 NOTE — Telephone Encounter (Signed)
Called pt with coverage for orthotics thru [employer] and left a message that they are not covered by insurance. It is diagnosis driven and the code provided by Dr E does not cover it.

## 2017-09-29 ENCOUNTER — Telehealth: Payer: Self-pay | Admitting: Family Medicine

## 2017-09-29 NOTE — Telephone Encounter (Signed)
Copied from [place] [id number]. Topic: Quick Communication - See Telephone Encounter >> Sep 29, 2017 12:58 PM Gardiner Ramus wrote: CRM for notification. See Telephone encounter for: 09/29/17. Pt called and stated that she would like to TOC from [place] to [place] Cb#[phone]. Please advise

## 2017-09-29 NOTE — Telephone Encounter (Signed)
This is fine with me as long as it's fine with Debbie.

## 2017-09-29 NOTE — Telephone Encounter (Signed)
Is it ok for pt to transfer?

## 2017-09-30 NOTE — Telephone Encounter (Signed)
That will be fine with me. 

## 2017-10-04 NOTE — Telephone Encounter (Signed)
Lvm asking pt to call back

## 2017-10-04 NOTE — Telephone Encounter (Signed)
Copied from [place] [id number]. Topic: Quick Communication - Office Called Patient >> Oct 04, 2017  1:28 PM Elie Confer wrote: Reason for CRM: Called to get pt scheduled for [facility] appt with Allie Bossier. Ok for [facility] to schedule. Pt has been scheduled

## 2017-11-11 ENCOUNTER — Ambulatory Visit: Payer: Managed Care, Other (non HMO) | Admitting: Primary Care

## 2017-11-11 ENCOUNTER — Encounter: Payer: Self-pay | Admitting: Primary Care

## 2017-11-11 VITALS — BP 118/74 | HR 102 | Temp 98.3°F | Ht 63.0 in | Wt 147.0 lb

## 2017-11-11 DIAGNOSIS — R5383 Other fatigue: Secondary | ICD-10-CM

## 2017-11-11 DIAGNOSIS — L700 Acne vulgaris: Secondary | ICD-10-CM | POA: Diagnosis not present

## 2017-11-11 DIAGNOSIS — N898 Other specified noninflammatory disorders of vagina: Secondary | ICD-10-CM | POA: Diagnosis not present

## 2017-11-11 DIAGNOSIS — N946 Dysmenorrhea, unspecified: Secondary | ICD-10-CM | POA: Diagnosis not present

## 2017-11-11 NOTE — Assessment & Plan Note (Signed)
Chronic and intermittent. Has been on 3 various OCP's of differing doses in the past and will experience different symptoms/effects from each. Referral placed to GYN for further evaluation.

## 2017-11-11 NOTE — Assessment & Plan Note (Signed)
Unclear etiology. Check CBC, vitamin B12, IBC panel, TSH.

## 2017-11-11 NOTE — Assessment & Plan Note (Signed)
Chronic for years, waxes and wanes. Given that she's been on several different OCP's and medications for this in the past, and also given evidence of scaring, will send to dermatology for further evaluation.

## 2017-11-11 NOTE — Patient Instructions (Signed)
Stop by the lab prior to leaving today. I will notify you of your results once received.   Stop by the front desk and speak with either Rosaria Ferries or Azalee Course regarding your referral to Dermatology and Gynecology.  It was a pleasure meeting you!

## 2017-11-11 NOTE — Progress Notes (Signed)
Subjective:    Patient ID: Jaclyn Bell, female    DOB: 1995/11/08, 22 y.o.   MRN: [id number]  HPI  Jaclyn Bell is a pleasant 22 year old female who presents today to transfer care from [employer]. She has several issues to discuss.   1) Dysmenorrhea: During her last visit in November 2018 she reported break through bleeding on current OCP that was changed by her prior PCP. She endorsed cramping the day before and day of menstrual cycle with cycles lasting 4-6 days. She was switched to [facility] during her last visit.   Since her last visit she's noticed resolve of the breakthrough bleeding, but continues to experience cramping and has noticed increased facial acne. 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   2) Acne Vulgaris: Chronic history. Increased facial acne shortly after her OCP was changed to [facility]. She noticed improvement in March/April this year with improvement in diet. 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 She's using an OTC facial wash from [employer] for the last one month with some improvement as her acne has dried out. She was once seeing dermatology in the past who prescribed Proactive, she had an allergic reaction. She does not wear make-up.   3) Vaginal Discharge: Intermittent vaginal discharge with vaginal itching to the labia majora since February 2019, more frequent over the last several months and occurs just prior to her period and also during ovulation. Discharge is whitish in color. Diagnosed with genital herpes in February 2019, positive culture. No herpes breakout since February 2019. She denies dysuria, urinary frequency, foul smelling discharge, vaginal lesions.   4) Fatigue: Daily. Endorses eating a diet that mostly consists of vegetables and chicken. No red meat. She feels tired daily despite a full night of sleep. She's worried that her iron levels are low. She denies  menorrhagia.  Review of Systems  Constitutional: Positive for fatigue.  Genitourinary: Positive for menstrual problem and vaginal discharge. Negative for dysuria, flank pain, frequency, pelvic pain and vaginal bleeding.  Skin:       Facial acne       No past medical history on file.   Social History   Socioeconomic History  . Marital status: Single    Spouse name: Not on file  . Number of children: Not on file  . Years of education: Not on file  . Highest education level: Not on file  Occupational History  . Not on file  Social Needs  . Financial resource strain: Not on file  . Food insecurity:    Worry: Not on file    Inability: Not on file  . Transportation needs:    Medical: Not on file    Non-medical: Not on file  Tobacco Use  . Smoking status: Never Smoker  . Smokeless tobacco: Never Used  Substance and Sexual Activity  . Alcohol use: No  . Drug use: No  . Sexual activity: Not Currently    Birth control/protection: Pill  Lifestyle  . Physical activity:    Days per week: Not on file    Minutes per session: Not on file  . Stress: Not on file  Relationships  . Social connections:    Talks on phone: Not on file    Gets together: Not on file    Attends religious service: Not on file    Active member of  club or organization: Not on file    Attends meetings of clubs or organizations: Not on file    Relationship status: Not on file  . Intimate partner violence:    Fear of current or ex partner: Not on file    Emotionally abused: Not on file    Physically abused: Not on file    Forced sexual activity: Not on file  Other Topics Concern  . 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   Lives with her mother and younger sister.   Enjoys shopping.    Past Surgical History:  Procedure Laterality Date  . TONSILLECTOMY AND ADENOIDECTOMY    . TUBES IN EARS Bilateral     Family History  Problem Relation Age  of Onset  . Hypertension Mother   . Mental illness Father   . Cancer Maternal Grandmother     No Known Allergies  Current Outpatient Medications on File Prior to Visit  Medication Sig Dispense Refill  . ethynodiol-ethinyl estradiol (KELNOR,ZOVIA) 1-35 MG-MCG tablet Take 1 tablet daily by mouth. 3 Package 4   No current facility-administered medications on file prior to visit.     BP 118/74   Pulse (!) 102   Temp 98.3 F (36.8 C) (Other (Comment))   Ht 5\' 3"  (1.6 m)   Wt 147 lb (66.7 kg)   LMP 11/03/2017   SpO2 98%   BMI 26.04 kg/m    Objective:   Physical Exam  Constitutional: She appears well-nourished.  HENT:  Head:    Cardiovascular: Normal rate.  Respiratory: Effort normal.  Genitourinary:    Cervix exhibits discharge. Cervix exhibits no motion tenderness. Right adnexum displays no tenderness. Left adnexum displays no tenderness. There is erythema in the vagina. No vaginal discharge found.  Genitourinary Comments: Scant amount of whitish discharge from cervix, no foul smell. Mild erythema to labia majora as noted in picture. No lesions.   Skin: Skin is warm and dry.  Acne vulgaris to bilateral cheeks. Evidence of scaring to left cheek.            Assessment & Plan:

## 2017-11-11 NOTE — Assessment & Plan Note (Signed)
Could be experiencing yeast type rash to labia majora. Will send off wet prep for further evaluation. She declines STD testing.  Discussed that vaginal discharge is common for a lot of women, especially during ovulation.

## 2017-11-12 LAB — CBC
HEMATOCRIT: 40.9 % (ref 34.0–46.6)
HEMOGLOBIN: 13.8 g/dL (ref 11.1–15.9)
MCH: 28.3 pg (ref 26.6–33.0)
MCHC: 33.7 g/dL (ref 31.5–35.7)
MCV: 84 fL (ref 79–97)
Platelets: 291 10*3/uL (ref 150–450)
RBC: 4.87 x10E6/uL (ref 3.77–5.28)
RDW: 14.1 % (ref 12.3–15.4)
WBC: 8.4 10*3/uL (ref 3.4–10.8)

## 2017-11-12 LAB — WET PREP BY MOLECULAR PROBE
CANDIDA SPECIES: NOT DETECTED
GARDNERELLA VAGINALIS: NOT DETECTED
MICRO NUMBER:: 91164431
SPECIMEN QUALITY:: ADEQUATE
Trichomonas vaginosis: NOT DETECTED

## 2017-11-12 LAB — IRON AND TIBC
IRON SATURATION: 41 % (ref 15–55)
Iron: 159 ug/dL (ref 27–159)
Total Iron Binding Capacity: 386 ug/dL (ref 250–450)
UIBC: 227 ug/dL (ref 131–425)

## 2017-11-12 LAB — TSH: TSH: 1.66 u[IU]/mL (ref 0.450–4.500)

## 2017-11-12 LAB — VITAMIN B12: VITAMIN B 12: 371 pg/mL (ref 232–1245)

## 2017-11-17 ENCOUNTER — Telehealth: Payer: Self-pay | Admitting: *Deleted

## 2017-11-17 NOTE — Telephone Encounter (Signed)
Copied from [place] [id number]. Topic: General - Other >> Nov 17, 2017  3:19 PM Carolyn Stare wrote: [facility] dermatology call to say they need more demographic information al least what the pt is being seen for   [facility]    Fax number [phone]

## 2017-11-17 NOTE — Telephone Encounter (Signed)
Records re-faxed to [facility]

## 2017-12-01 ENCOUNTER — Encounter: Payer: Managed Care, Other (non HMO) | Admitting: Obstetrics & Gynecology

## 2017-12-07 ENCOUNTER — Ambulatory Visit: Payer: Managed Care, Other (non HMO) | Admitting: Family Medicine

## 2017-12-15 ENCOUNTER — Encounter: Payer: Managed Care, Other (non HMO) | Admitting: Obstetrics & Gynecology

## 2017-12-22 ENCOUNTER — Encounter: Payer: Self-pay | Admitting: Radiology

## 2017-12-22 ENCOUNTER — Encounter: Payer: Managed Care, Other (non HMO) | Admitting: Obstetrics & Gynecology

## 2017-12-22 ENCOUNTER — Other Ambulatory Visit: Payer: Self-pay | Admitting: Family Medicine

## 2017-12-22 ENCOUNTER — Ambulatory Visit: Payer: Managed Care, Other (non HMO) | Admitting: Obstetrics & Gynecology

## 2017-12-22 ENCOUNTER — Encounter: Payer: Self-pay | Admitting: Obstetrics & Gynecology

## 2017-12-22 VITALS — BP 125/80 | HR 102 | Ht 63.0 in | Wt 146.0 lb

## 2017-12-22 DIAGNOSIS — B9689 Other specified bacterial agents as the cause of diseases classified elsewhere: Secondary | ICD-10-CM | POA: Diagnosis not present

## 2017-12-22 DIAGNOSIS — N946 Dysmenorrhea, unspecified: Secondary | ICD-10-CM

## 2017-12-22 DIAGNOSIS — N760 Acute vaginitis: Secondary | ICD-10-CM | POA: Diagnosis not present

## 2017-12-22 DIAGNOSIS — L700 Acne vulgaris: Secondary | ICD-10-CM | POA: Diagnosis not present

## 2017-12-22 MED ORDER — NORGESTIMATE-ETH ESTRADIOL 0.25-35 MG-MCG PO TABS: 1.0000 | ORAL_TABLET | Freq: Every day | ORAL | 6 refills | Status: DC

## 2017-12-22 MED ORDER — NAPROXEN 500 MG PO TABS: 500.0000 mg | ORAL_TABLET | Freq: Two times a day (BID) | ORAL | 2 refills | Status: DC

## 2017-12-22 MED ORDER — NYSTATIN-TRIAMCINOLONE 100000-0.1 UNIT/GM-% EX OINT: 1.0000 "application " | TOPICAL_OINTMENT | Freq: Two times a day (BID) | CUTANEOUS | 1 refills | Status: AC

## 2017-12-22 NOTE — Progress Notes (Signed)
GYNECOLOGY OFFICE VISIT NOTE  History:  22 y.o. G0 here today for evaluation of dysmenorrhea and acne. Was using Yasmin for contraception but had a lot of breakthrough bleeding. She was switched to [facility], this helped her bleeding but her acne worsened. Also had worsening dysmenorrhea; withdrawal bleeding lasts for four days with a lot of cramping. Wants to try another formulation of pills that will help her acne, dysmenorrhea without much breakthrough bleeding. She also reports increased vaginal irritation and labial swelling since she started Kelnor, nad negative wet prep in 10/2017, but has recently taken yeast medication. Wants evaluation for this. She denies any current abnormal vaginal bleeding, pelvic pain, concerns with untercourse or other concerns.   Past Medical History:  Diagnosis Date  . Genital herpes simplex type 1 infection 03/18/2017    Past Surgical History:  Procedure Laterality Date  . FOOT SURGERY     pin in place  . TONSILLECTOMY AND ADENOIDECTOMY    . TUBES IN EARS Bilateral     The following portions of the patient's history were reviewed and updated as appropriate: allergies, current medications, past family history, past medical history, past social history, past surgical history and problem list.   Health Maintenance: Normal pap ion 03/2017, has received HPV vaccine series.  Review of Systems:  Pertinent items noted in HPI and remainder of comprehensive ROS otherwise negative.  Objective:  Physical Exam BP 125/80   Pulse (!) 102   Ht 5\' 3"  (1.6 m)   Wt 146 lb (66.2 kg)   LMP 11/30/2017   BMI 25.86 kg/m  CONSTITUTIONAL: Well-developed, well-nourished female in no acute distress.  HENT:  Normocephalic, atraumatic, External right and left ear normal. Oropharynx is clear and moist EYES: Conjunctivae and EOM are normal. Pupils are equal, round, and reactive to light. No scleral icterus.  NECK: Normal range of motion, supple, no masses.  Normal thyroid.    SKIN: Skin is warm and dry. Diffuse acne noted on face. Not diaphoretic. No erythema. No pallor. NEUROLOGIC: Alert and oriented to person, place, and time. Normal reflexes, muscle tone coordination. No cranial nerve deficit noted. PSYCHIATRIC: Normal mood and affect. Normal behavior. Normal judgment and thought content. CARDIOVASCULAR: Normal heart rate noted, regular rhythm RESPIRATORY: Clear to auscultation bilaterally. Effort and breath sounds normal, no problems with respiration noted. BREASTS: Symmetric in size. No masses, skin changes, nipple drainage, or lymphadenopathy. MUSCULOSKELETAL: Normal range of motion. No edema noted. ABDOMEN: Soft, no distention noted.   PELVIC: Normal appearing external genitalia but with diffuse erythema involing bilateral labia majora, no lesions, normal appearing vaginal mucosa and cervix.  Thin white discharge noted, testing sample obtained.    Assessment & Plan:  1. Acne vulgaris 2. Dysmenorrhea Had a long discussion about management of these symptoms with continuous OCPs. Will try Sprintec formulation. Naproxen prescribed for dysmenorrhea also. Will monitor response, reevaluate in two months.  - norgestimate-ethinyl estradiol (ORTHO-CYCLEN,SPRINTEC,PREVIFEM) 0.25-35 MG-MCG tablet; Take 1 tablet by mouth daily. Take only the active hormonal pills continuously as directed  Dispense: 3 Package; Refill: 6 - naproxen (NAPROSYN) 500 MG tablet; Take 1 tablet (500 mg total) by mouth 2 (two) times daily with a meal. As needed for pain  Dispense: 60 tablet; Refill: 2  3. Vulvovaginitis - NuSwab VG+, Candida 6sp done, will follow up results and manage accordingly. - nystatin-triamcinolone ointment (MYCOLOG); Apply 1 application topically 2 (two) times daily for 7 days.  Dispense: 30 g; Refill: 1. This medication was prescribed given erythema, she was told  to call/come back for worsening symptoms. Proper vulvar hygiene emphasized: discussed avoidance of perfumed  soaps, detergents, lotions and any type of douches; in addition to wearing cotton underwear and no underwear at night.  Also recommended cleaning front to back, voiding and cleaning up after intercourse.   Please refer to After Visit Summary for other counseling recommendations.   Return in about 2 months (around 02/21/2018) for Followup.  Total face-to-face time with patient: 30 minutes.  Over 50% of encounter was spent on counseling and coordination of care.   Verita Schneiders, MD, [place] for [employer] [facility], [place]

## 2017-12-22 NOTE — Patient Instructions (Signed)
Return to clinic for any scheduled appointments or for any gynecologic concerns as needed.   

## 2017-12-26 LAB — NUSWAB VG+, CANDIDA 6SP
Atopobium vaginae: HIGH Score — AB
CANDIDA GLABRATA, NAA: NEGATIVE
CANDIDA PARAPSILOSIS, NAA: NEGATIVE
CANDIDA TROPICALIS, NAA: NEGATIVE
Candida albicans, NAA: NEGATIVE
Candida krusei, NAA: NEGATIVE
Candida lusitaniae, NAA: NEGATIVE
Chlamydia trachomatis, NAA: NEGATIVE
Neisseria gonorrhoeae, NAA: NEGATIVE
Trich vag by NAA: NEGATIVE

## 2017-12-26 MED ORDER — METRONIDAZOLE 500 MG PO TABS: 500.0000 mg | ORAL_TABLET | Freq: Two times a day (BID) | ORAL | 0 refills | Status: DC

## 2017-12-26 NOTE — Addendum Note (Signed)
Addended by: Verita Schneiders A on: 12/26/2017 11:45 AM   Modules accepted: Orders

## 2018-02-09 ENCOUNTER — Ambulatory Visit: Payer: Managed Care, Other (non HMO) | Admitting: Obstetrics & Gynecology

## 2018-02-09 ENCOUNTER — Encounter: Payer: Self-pay | Admitting: Obstetrics & Gynecology

## 2018-02-09 VITALS — BP 130/83 | HR 86 | Ht 63.0 in | Wt 145.0 lb

## 2018-02-09 DIAGNOSIS — N946 Dysmenorrhea, unspecified: Secondary | ICD-10-CM

## 2018-02-09 DIAGNOSIS — L700 Acne vulgaris: Secondary | ICD-10-CM | POA: Diagnosis not present

## 2018-02-09 MED ORDER — DROSPIRENONE-ETHINYL ESTRADIOL 3-0.02 MG PO TABS: 1.0000 | ORAL_TABLET | Freq: Every day | ORAL | 11 refills | Status: DC

## 2018-02-09 NOTE — Patient Instructions (Signed)
Return to clinic for any scheduled appointments or for any gynecologic concerns as needed.   

## 2018-02-09 NOTE — Progress Notes (Signed)
    GYNECOLOGY OFFICE VISIT NOTE  History:  22 y.o. G0P0000 here today for follow up after OCP prescription for dysmenorrhea and acne vulgaris.  She is happy with her results from the OCPs, acne has cleared up significantly. No dysmenorrhea on continuous active pills, just had some spotting a few weeks ago, but otherwise doing okay. No side effects.  Seen by Dermatologist, added Spironolactone 25 mg daily to regimen She denies any abnormal vaginal discharge, pelvic pain or other concerns.   Past Medical History:  Diagnosis Date  . Genital herpes simplex type 1 infection 03/18/2017    Past Surgical History:  Procedure Laterality Date  . FOOT SURGERY     pin in place  . TONSILLECTOMY AND ADENOIDECTOMY    . TUBES IN EARS Bilateral     The following portions of the patient's history were reviewed and updated as appropriate: allergies, current medications, past family history, past medical history, past social history, past surgical history and problem list.   Health Maintenance:  Normal pap on 03/18/2017.     Review of Systems:  Pertinent items noted in HPI and remainder of comprehensive ROS otherwise negative.  Objective:  Physical Exam BP 130/83   Pulse 86   Ht 5\' 3"  (1.6 m)   Wt 145 lb (65.8 kg)   LMP 01/01/2018   BMI 25.69 kg/m   BP Readings from Last 3 Encounters:  02/09/18 130/83  12/22/17 125/80  11/11/17 118/74   CONSTITUTIONAL: Well-developed, well-nourished female in no acute distress.  MUSCULOSKELETAL: Normal range of motion. No edema noted. NEUROLOGIC: Alert and oriented to person, place, and time. Normal muscle tone coordination. No cranial nerve deficit noted. PSYCHIATRIC: Normal mood and affect. Normal behavior. Normal judgment and thought content. CARDIOVASCULAR: Normal heart rate noted RESPIRATORY: Effort and breath sounds normal, no problems with respiration noted ABDOMEN: No masses noted. No other overt distention noted.   PELVIC: Deferred   Assessment  & Plan:  1. Acne vulgaris 2. Dysmenorrhea Her elevated BP is concerning.  Highest it has ever been. Talked to patient about switching to lower estrogen pill.  She said she wanted to check her BP at home and start the spironolactone, does not want to switch if her BP normalizes. Advised about cardiovascular risks with elevated BP.  She will contact us with BP readings done at home, told to switch to lower estrogen pill for BP >130/80.  She will follow up with Dermatology also.  - drospirenone-ethinyl estradiol (YAZ,GIANVI,LORYNA) 3-0.02 MG tablet; Take 1 tablet by mouth daily. Take active pills only  Dispense: 1 Package; Refill: 11  Routine preventative health maintenance measures emphasized. Please refer to After Visit Summary for other counseling recommendations.   Return in about 1 month (around 03/12/2018) for Follow up/OCP check.   Total face-to-face time with patient: 15 minutes.  Over 50% of encounter was spent on counseling and coordination of care.   Verita Schneiders, MD, [place] for [employer], [place]

## 2018-02-09 NOTE — Progress Notes (Signed)
She is seeing dermatologist for acne and her dermatologist game her an Rx for Spironolactone 25mg  tablet for a hormone boost, but she has not started this yet.  Her mom was concerned for her to take since they have a high family history of breast cancer.  This cycle she skipped the sugar pills on her POP's and started and new pack by mistake, so she did skip this period.

## 2018-02-10 NOTE — Addendum Note (Signed)
Addended by: Verita Schneiders A on: 02/10/2018 11:03 AM   Modules accepted: Orders

## 2018-02-10 NOTE — Progress Notes (Signed)
Addendum to 02/09/18 Encounter  Patient sent a message today reporting BP at home of 100/72. She was told to continue BP monitoring at home, and to continue current OCPs for now.  Will discontinue new OCP prescription (Yaz)  for now. She will still follow up in 1 month as scheduled.  Verita Schneiders, MD, [place] for [employer], [place]

## 2018-03-16 ENCOUNTER — Ambulatory Visit: Payer: Managed Care, Other (non HMO) | Admitting: Obstetrics & Gynecology

## 2018-08-14 ENCOUNTER — Telehealth: Payer: Self-pay

## 2018-08-14 NOTE — Telephone Encounter (Signed)
Pt left v/m that she had been seeing a chiropractor and is having numbness in lt lower leg from knee cap down. Pt wants appt with Gentry Fitz NP. Left v/m for pt to cb to schedule appt.

## 2018-08-15 NOTE — Telephone Encounter (Signed)
[place] Night - Client Nonclinical Telephone Record AccessNurse Client [facility] Night - Client Client Site [place] Physician Alma Friendly - NP Contact Type Call Who Is Calling Patient / Member / Family / Caregiver Caller Name [place] Phone Number [phone] 1-2 Call Type Message Only Information Provided Reason for Call Returning a Call from the Office Initial [place] states she would like to make a appointment Additional Comment Call Closed By: Ignacia Marvel Transaction Date/Time: 08/14/2018 5:22:10 PM (ET)

## 2018-08-16 ENCOUNTER — Other Ambulatory Visit: Payer: Self-pay

## 2018-08-16 ENCOUNTER — Ambulatory Visit (INDEPENDENT_AMBULATORY_CARE_PROVIDER_SITE_OTHER): Payer: Managed Care, Other (non HMO) | Admitting: Primary Care

## 2018-08-16 ENCOUNTER — Encounter: Payer: Self-pay | Admitting: Primary Care

## 2018-08-16 ENCOUNTER — Ambulatory Visit (INDEPENDENT_AMBULATORY_CARE_PROVIDER_SITE_OTHER)
Admission: RE | Admit: 2018-08-16 | Discharge: 2018-08-16 | Disposition: A | Discharge status: Home / Self Care | Payer: Managed Care, Other (non HMO) | Source: Ambulatory Visit | Attending: Primary Care | Admitting: Primary Care

## 2018-08-16 VITALS — BP 116/80 | HR 82 | Temp 97.8°F | Ht 63.0 in | Wt 137.0 lb

## 2018-08-16 DIAGNOSIS — R200 Anesthesia of skin: Secondary | ICD-10-CM

## 2018-08-16 HISTORY — DX: Anesthesia of skin: R20.0

## 2018-08-16 NOTE — Patient Instructions (Signed)
Complete xray(s) prior to leaving today. I will notify you of your results once received.  It was a pleasure to see you today!  

## 2018-08-16 NOTE — Assessment & Plan Note (Addendum)
Chronic for three months, worse over last three weeks. Exam today grossly unremarkable and stable. No alarm signs.   Differentials include orthopedic cause, neuropathy, MS. Lower suspicion for DVT given lack of calf swelling or erythema.   Start with repeating lumbar plain films today. Consider MRI given neurological symptoms. Also consider PT. Await xray results.

## 2018-08-16 NOTE — Progress Notes (Signed)
Subjective:    Patient ID: Jaclyn Bell, female    DOB: 1995-09-23, 23 y.o.   MRN: [id number]  HPI  [name] is a 23 year old female who presents today with a chief complaint of lower extremity numbness.  Today she endorses numbness and pain to the left posterior knee with radiation down to her posterior ankle. Present for the last several months, worse over the last 3 weeks.   She presented to a chiropractor several weeks ago, underwent "posture scan" and xrays and was told that her lumbar vertebra were abnormal. She was told that her extremity numbness was secondary to her lumbar spine. It was recommended she undergo "adjustments" three times weekly for three months. Last week she underwent a few adjustments and "tension" machine. It was recommended that she avoid sitting for prolonged periods of time and that she needed to stand and sit.   She's been taking [employer], BioFreeze, Tylenol without improvement. She describes her pain as sharp, numbness, "heavy dead weight". She does experience bilateral lower back pain after standing for long periods of time, improved with Tylenol or Ibuprofen. This has been intermittent for years. She denies lower extremity weakness, loss of bowel/bladder control, numbness/tinlging to upper extremities or right lower extremity.   Review of Systems  Musculoskeletal: Positive for myalgias.       Left lower extremity pain, see HPI  Skin: Negative for color change.  Neurological: Positive for numbness. Negative for weakness.       Past Medical History:  Diagnosis Date  . Genital herpes simplex type 1 infection 03/18/2017     Social History   Socioeconomic History  . Marital status: Single    Spouse name: Not on file  . Number of children: Not on file  . Years of education: Not on file  . Highest education level: Not on file  Occupational History  . Not on file  Social Needs  . Financial resource strain: Not on file  . Food insecurity    Worry:  Not on file    Inability: Not on file  . Transportation needs    Medical: Not on file    Non-medical: Not on file  Tobacco Use  . Smoking status: Never Smoker  . Smokeless tobacco: Never Used  Substance and Sexual Activity  . Alcohol use: Yes    Comment: occ  . Drug use: No  . Sexual activity: Yes    Birth control/protection: Pill  Lifestyle  . Physical activity    Days per week: Not on file    Minutes per session: Not on file  . Stress: Not on file  Relationships  . Social Herbalist on phone: Not on file    Gets together: Not on file    Attends religious service: Not on file    Active member of club or organization: Not on file    Attends meetings of clubs or organizations: Not on file    Relationship status: Not on file  . Intimate partner violence    Fear of current or ex partner: Not on file    Emotionally abused: Not on file    Physically abused: Not on file    Forced sexual activity: Not on file  Other Topics Concern  . 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   Lives with her mother and younger sister.   Enjoys shopping.    Past  Surgical History:  Procedure Laterality Date  . FOOT SURGERY     pin in place  . TONSILLECTOMY AND ADENOIDECTOMY    . TUBES IN EARS Bilateral     Family History  Problem Relation Age of Onset  . Hypertension Mother   . Mental illness Father   . Cancer Maternal Grandmother     No Known Allergies  Current Outpatient Medications on File Prior to Visit  Medication Sig Dispense Refill  . norgestimate-ethinyl estradiol (ORTHO-CYCLEN,SPRINTEC,PREVIFEM) 0.25-35 MG-MCG tablet Take 1 tablet by mouth daily. Take only the active hormonal pills continuously as directed 3 Package 6  . naproxen (NAPROSYN) 500 MG tablet Take 1 tablet (500 mg total) by mouth 2 (two) times daily with a meal. As needed for pain (Patient not taking: Reported on 08/16/2018) 60 tablet 2  .  triamcinolone (KENALOG) 0.025 % cream Apply 1 application topically 2 (two) times daily.     No current facility-administered medications on file prior to visit.     BP 116/80   Pulse 82   Temp 97.8 F (36.6 C) (Temporal)   Ht 5\' 3"  (1.6 m)   Wt 137 lb (62.1 kg)   LMP 07/30/2018   SpO2 98%   BMI 24.27 kg/m    Objective:   Physical Exam  Constitutional: She is oriented to person, place, and time. She appears well-nourished.  Cardiovascular:  No calf swelling  Respiratory: Effort normal.  Musculoskeletal:     Comments: 5/5 strength to bilateral lower extremities.  Neurological: She is alert and oriented to person, place, and time. She has normal reflexes.  Skin: No erythema.  Psychiatric: She has a normal mood and affect.           Assessment & Plan:

## 2018-08-17 DIAGNOSIS — R200 Anesthesia of skin: Secondary | ICD-10-CM

## 2018-09-08 ENCOUNTER — Ambulatory Visit
Admission: RE | Admit: 2018-09-08 | Discharge: 2018-09-08 | Disposition: A | Discharge status: Home / Self Care | Payer: Managed Care, Other (non HMO) | Source: Ambulatory Visit | Attending: Primary Care | Admitting: Primary Care

## 2018-09-08 DIAGNOSIS — R200 Anesthesia of skin: Secondary | ICD-10-CM

## 2018-09-22 ENCOUNTER — Encounter: Payer: Self-pay | Admitting: Podiatry

## 2018-09-22 ENCOUNTER — Ambulatory Visit: Payer: Managed Care, Other (non HMO) | Admitting: Podiatry

## 2018-09-22 ENCOUNTER — Other Ambulatory Visit: Payer: Self-pay

## 2018-09-22 ENCOUNTER — Other Ambulatory Visit: Payer: Self-pay | Admitting: Podiatry

## 2018-09-22 ENCOUNTER — Ambulatory Visit (INDEPENDENT_AMBULATORY_CARE_PROVIDER_SITE_OTHER): Payer: Managed Care, Other (non HMO)

## 2018-09-22 VITALS — Temp 98.1°F

## 2018-09-22 DIAGNOSIS — G5792 Unspecified mononeuropathy of left lower limb: Secondary | ICD-10-CM

## 2018-09-22 DIAGNOSIS — M7752 Other enthesopathy of left foot: Secondary | ICD-10-CM

## 2018-09-22 DIAGNOSIS — L989 Disorder of the skin and subcutaneous tissue, unspecified: Secondary | ICD-10-CM | POA: Diagnosis not present

## 2018-09-22 DIAGNOSIS — R202 Paresthesia of skin: Secondary | ICD-10-CM

## 2018-09-24 NOTE — Progress Notes (Signed)
   Subjective: 23 y.o. female presenting to the office today with a chief complaint of left foot pain that began about 5 months ago secondary to an injury. She states she twisted the foot and has had pain and numbness since. She reports associated paresthesias from the left knee down to the foot. She also reports painful callus lesions of the bilateral feet that have been symptomatic for the past several months. Walking and bearing weight increases the foot pain and the pain from the calluses. She has not had any recent treatment for the symptoms. Patient is here for further evaluation and treatment.   Past Medical History:  Diagnosis Date  . Genital herpes simplex type 1 infection 03/18/2017     Objective:  Physical Exam General: Alert and oriented x3 in no acute distress  Dermatology: Hyperkeratotic lesion(s) present on the sub-fifth MPJ of the bilateral feet. Pain on palpation with a central nucleated core noted. Skin is warm, dry and supple bilateral lower extremities. Negative for open lesions or macerations.  Vascular: Palpable pedal pulses bilaterally. No edema or erythema noted. Capillary refill within normal limits.  Neurological: Epicritic and protective threshold intact. Paresthesia / neuritis noted to the posterior left leg.   Musculoskeletal Exam: Pain on palpation at the keratotic lesion(s) noted. Painful left calf with compression. Range of motion within normal limits bilateral. Muscle strength 5/5 in all groups bilateral.  Assessment: 1. Neuritis / paresthesias posterior left leg 2. Painful calf with compression left 3. Porokeratosis sub-fifth MPJ bilateral x 2   Plan of Care:  1. Patient evaluated 2. Excisional debridement of keratoic lesions using a chisel blade was performed without incident.  3. Dressed area with light dressing. 4. Orders for nerve conduction study placed.  5. Orders for venous doppler study placed.  6. Patient is being referred to Neuro by PCP.   7. Patient is to return to the clinic PRN.   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   Edrick Kins, DPM [facility]  Dr. Edrick Kins, [address]                                        [place], [place] [address]                Office [phone]  Fax [phone]

## 2018-09-25 ENCOUNTER — Telehealth: Payer: Self-pay

## 2018-09-25 DIAGNOSIS — R202 Paresthesia of skin: Secondary | ICD-10-CM

## 2018-09-25 DIAGNOSIS — G5792 Unspecified mononeuropathy of left lower limb: Secondary | ICD-10-CM

## 2018-09-25 NOTE — Telephone Encounter (Signed)
-----   Message from Edrick Kins, [place] sent at 09/22/2018 11:20 AM EDT ----- Regarding: nerve conduction study and venous doppler left Please order nerve conduction study left lower extremity.   Also please order venous doppler left lower extremity  Dx: paresthesia LLE. Rule out DVT LLE.   Thanks, Dr. Amalia Hailey

## 2018-09-25 NOTE — Telephone Encounter (Signed)
Patient has been scheduled with [facility] clinic neurology for NCS on 10/19/2018 @ 8:30 am.   Patient has been notified of appt.

## 2018-09-28 ENCOUNTER — Other Ambulatory Visit: Payer: Self-pay | Admitting: Podiatry

## 2018-09-28 ENCOUNTER — Ambulatory Visit (INDEPENDENT_AMBULATORY_CARE_PROVIDER_SITE_OTHER): Payer: Managed Care, Other (non HMO)

## 2018-09-28 ENCOUNTER — Other Ambulatory Visit: Payer: Self-pay

## 2018-09-28 DIAGNOSIS — M79605 Pain in left leg: Secondary | ICD-10-CM | POA: Diagnosis not present

## 2018-09-28 DIAGNOSIS — R202 Paresthesia of skin: Secondary | ICD-10-CM | POA: Diagnosis not present

## 2018-09-28 DIAGNOSIS — I83812 Varicose veins of left lower extremities with pain: Secondary | ICD-10-CM

## 2018-11-15 ENCOUNTER — Encounter: Payer: Self-pay | Admitting: Internal Medicine

## 2019-01-05 ENCOUNTER — Other Ambulatory Visit: Payer: Self-pay | Admitting: Neurology

## 2019-01-05 DIAGNOSIS — M79605 Pain in left leg: Secondary | ICD-10-CM

## 2019-01-10 ENCOUNTER — Other Ambulatory Visit: Payer: Self-pay | Admitting: *Deleted

## 2019-01-10 DIAGNOSIS — N946 Dysmenorrhea, unspecified: Secondary | ICD-10-CM

## 2019-01-10 DIAGNOSIS — L700 Acne vulgaris: Secondary | ICD-10-CM

## 2019-01-10 MED ORDER — NORGESTIMATE-ETH ESTRADIOL 0.25-35 MG-MCG PO TABS: 1.0000 | ORAL_TABLET | Freq: Every day | ORAL | 2 refills | Status: DC

## 2019-01-19 ENCOUNTER — Ambulatory Visit: Payer: Managed Care, Other (non HMO)

## 2019-01-25 ENCOUNTER — Other Ambulatory Visit: Payer: Self-pay

## 2019-01-25 ENCOUNTER — Ambulatory Visit
Admission: RE | Admit: 2019-01-25 | Discharge: 2019-01-25 | Disposition: A | Discharge status: Home / Self Care | Payer: Managed Care, Other (non HMO) | Source: Ambulatory Visit | Attending: Neurology | Admitting: Neurology

## 2019-01-25 DIAGNOSIS — M79605 Pain in left leg: Secondary | ICD-10-CM | POA: Diagnosis not present

## 2019-01-25 MED ORDER — GADOBUTROL 1 MMOL/ML IV SOLN
6.0000 mL | Freq: Once | INTRAVENOUS | Status: AC | PRN
  Administered 2019-01-25: 18:00:00 6 mL via INTRAVENOUS

## 2019-07-10 ENCOUNTER — Encounter: Payer: Self-pay | Admitting: Obstetrics & Gynecology

## 2019-07-10 ENCOUNTER — Other Ambulatory Visit: Payer: Self-pay

## 2019-07-10 ENCOUNTER — Ambulatory Visit (INDEPENDENT_AMBULATORY_CARE_PROVIDER_SITE_OTHER): Payer: Managed Care, Other (non HMO) | Admitting: Obstetrics & Gynecology

## 2019-07-10 VITALS — BP 117/80 | HR 76 | Ht 63.0 in | Wt 129.0 lb

## 2019-07-10 DIAGNOSIS — Z1272 Encounter for screening for malignant neoplasm of vagina: Secondary | ICD-10-CM | POA: Diagnosis not present

## 2019-07-10 DIAGNOSIS — Z01419 Encounter for gynecological examination (general) (routine) without abnormal findings: Secondary | ICD-10-CM | POA: Diagnosis not present

## 2019-07-10 DIAGNOSIS — Z3041 Encounter for surveillance of contraceptive pills: Secondary | ICD-10-CM

## 2019-07-10 LAB — HM PAP SMEAR

## 2019-07-10 NOTE — Progress Notes (Signed)
GYNECOLOGY ANNUAL PREVENTATIVE CARE ENCOUNTER NOTE  History:     Jaclyn Bell is a 24 y.o. G64 female here for a routine annual gynecologic exam.  Current complaints: she takes 3 months of active pills then is off for a week to get period; but after her period, she gets prolonged spotting for up to a week. This has has happened in last 6 months, also reports other intense life stresses.   Denies abnormal vaginal discharge, pelvic pain, problems with intercourse or other gynecologic concerns.    Gynecologic History Patient's last menstrual period was 06/26/2019 (exact date). Contraception: OCP (estrogen/progesterone) No pap smear history.  Obstetric History OB History  Gravida Para Term Preterm AB Living  0 0 0 0 0 0  SAB TAB Ectopic Multiple Live Births  0 0 0 0 0    Past Medical History:  Diagnosis Date  . Genital herpes simplex type 1 infection 03/18/2017    Past Surgical History:  Procedure Laterality Date  . FOOT SURGERY     pin in place  . TONSILLECTOMY AND ADENOIDECTOMY    . TUBES IN EARS Bilateral     Current Outpatient Medications on File Prior to Visit  Medication Sig Dispense Refill  . norgestimate-ethinyl estradiol (ORTHO-CYCLEN) 0.25-35 MG-MCG tablet Take 1 tablet by mouth daily. Take only the active hormonal pills continuously as directed 3 Package 2  . naproxen (NAPROSYN) 500 MG tablet Take 1 tablet (500 mg total) by mouth 2 (two) times daily with a meal. As needed for pain (Patient not taking: Reported on 08/16/2018) 60 tablet 2  . triamcinolone (KENALOG) 0.025 % cream Apply 1 application topically 2 (two) times daily.     No current facility-administered medications on file prior to visit.    No Known Allergies  Social History:  reports that she has never smoked. She has never used smokeless tobacco. She reports current alcohol use. She reports that she does not use drugs.  Family History  Problem Relation Age of Onset  . Hypertension Mother   .  Mental illness Father   . Cancer Maternal Grandmother     The following portions of the patient's history were reviewed and updated as appropriate: allergies, current medications, past family history, past medical history, past social history, past surgical history and problem list.  Review of Systems Pertinent items noted in HPI and remainder of comprehensive ROS otherwise negative.  Physical Exam:  BP 117/80   Pulse 76   Ht 5\' 3"  (1.6 m)   Wt 129 lb (58.5 kg)   LMP 06/26/2019 (Exact Date)   BMI 22.85 kg/m  CONSTITUTIONAL: Well-developed, well-nourished female in no acute distress.  HENT:  Normocephalic, atraumatic, External right and left ear normal. Oropharynx is clear and moist EYES: Conjunctivae and EOM are normal. Pupils are equal, round, and reactive to light. No scleral icterus.  NECK: Normal range of motion, supple, no masses.  Normal thyroid.  SKIN: Skin is warm and dry. No rash noted. Not diaphoretic. No erythema. No pallor. MUSCULOSKELETAL: Normal range of motion. No tenderness.  No cyanosis, clubbing, or edema.  2+ distal pulses. NEUROLOGIC: Alert and oriented to person, place, and time. Normal reflexes, muscle tone coordination.  PSYCHIATRIC: Normal mood and affect. Normal behavior. Normal judgment and thought content. CARDIOVASCULAR: Normal heart rate noted, regular rhythm RESPIRATORY: Clear to auscultation bilaterally. Effort and breath sounds normal, no problems with respiration noted. BREASTS: Symmetric in size. No masses, tenderness, skin changes, nipple drainage, or lymphadenopathy bilaterally. Performed in  the presence of a chaperone. ABDOMEN: Soft, no distention noted.  No tenderness, rebound or guarding.  PELVIC: Normal appearing external genitalia and urethral meatus; normal appearing vaginal mucosa and cervix.  No abnormal discharge noted.  Pap smear obtained.  Normal uterine size, no other palpable masses, no uterine or adnexal tenderness.  Performed in the  presence of a chaperone.   Assessment and Plan:      1. Uses oral contraception Discussed her OCPs, she is on Ortho Cyclen. Recommended to skip the placebo week after three months and just continue active pills daily. Will follow up bleeding pattern. If continues, may consider switching OCP formulaiton or considering other options.    2. Well woman exam with routine gynecological exam - CBC - TSH - Hemoglobin A1c - Comprehensive metabolic panel - Lipid panel - VITAMIN D 25 Hydroxy (Vit-D Deficiency, Fractures) - Hepatitis B surface antigen - Hepatitis C antibody - RPR - HIV Antibody (routine testing w rflx) - Pap IG w/ reflex to HPV when ASC-U - Chlamydia/Gonococcus/Trichomonas, NAA Will follow up results of pap smear and other preventative healthcare maintenance labs and manage accordingly. Routine preventative health maintenance measures emphasized. Please refer to After Visit Summary for other counseling recommendations.      Verita Schneiders, MD, [place] for [employer], [place]

## 2019-07-10 NOTE — Patient Instructions (Signed)
Preventive Care 21-24 Years Old, Female Preventive care refers to visits with your health care provider and lifestyle choices that can promote health and wellness. This includes:  A yearly physical exam. This may also be called an annual well check.  Regular dental visits and eye exams.  Immunizations.  Screening for certain conditions.  Healthy lifestyle choices, such as eating a healthy diet, getting regular exercise, not using drugs or products that contain nicotine and tobacco, and limiting alcohol use. What can I expect for my preventive care visit? Physical exam Your health care provider will check your:  Height and weight. This may be used to calculate body mass index (BMI), which tells if you are at a healthy weight.  Heart rate and blood pressure.  Skin for abnormal spots. Counseling Your health care provider may ask you questions about your:  Alcohol, tobacco, and drug use.  Emotional well-being.  Home and relationship well-being.  Sexual activity.  Eating habits.  Work and work environment.  Method of birth control.  Menstrual cycle.  Pregnancy history. What immunizations do I need?  Influenza (flu) vaccine  This is recommended every year. Tetanus, diphtheria, and pertussis (Tdap) vaccine  You may need a Td booster every 10 years. Varicella (chickenpox) vaccine  You may need this if you have not been vaccinated. Human papillomavirus (HPV) vaccine  If recommended by your health care provider, you may need three doses over 6 months. Measles, mumps, and rubella (MMR) vaccine  You may need at least one dose of MMR. You may also need a second dose. Meningococcal conjugate (MenACWY) vaccine  One dose is recommended if you are age 19-21 years and a first-year college student living in a residence hall, or if you have one of several medical conditions. You may also need additional booster doses. Pneumococcal conjugate (PCV13) vaccine  You may need  this if you have certain conditions and were not previously vaccinated. Pneumococcal polysaccharide (PPSV23) vaccine  You may need one or two doses if you smoke cigarettes or if you have certain conditions. Hepatitis A vaccine  You may need this if you have certain conditions or if you travel or work in places where you may be exposed to hepatitis A. Hepatitis B vaccine  You may need this if you have certain conditions or if you travel or work in places where you may be exposed to hepatitis B. Haemophilus influenzae type b (Hib) vaccine  You may need this if you have certain conditions. You may receive vaccines as individual doses or as more than one vaccine together in one shot (combination vaccines). Talk with your health care provider about the risks and benefits of combination vaccines. What tests do I need?  Blood tests  Lipid and cholesterol levels. These may be checked every 5 years starting at age 20.  Hepatitis C test.  Hepatitis B test. Screening  Diabetes screening. This is done by checking your blood sugar (glucose) after you have not eaten for a while (fasting).  Sexually transmitted disease (STD) testing.  BRCA-related cancer screening. This may be done if you have a family history of breast, ovarian, tubal, or peritoneal cancers.  Pelvic exam and Pap test. This may be done every 3 years starting at age 21. Starting at age 30, this may be done every 5 years if you have a Pap test in combination with an HPV test. Talk with your health care provider about your test results, treatment options, and if necessary, the need for more tests.   Follow these instructions at home: Eating and drinking   Eat a diet that includes fresh fruits and vegetables, whole grains, lean protein, and low-fat dairy.  Take vitamin and mineral supplements as recommended by your health care provider.  Do not drink alcohol if: ? Your health care provider tells you not to drink. ? You are  pregnant, may be pregnant, or are planning to become pregnant.  If you drink alcohol: ? Limit how much you have to 0-1 drink a day. ? Be aware of how much alcohol is in your drink. In the [place] one drink equals one 12 oz bottle of beer (355 mL), one 5 oz glass of wine (148 mL), or one 1 oz glass of hard liquor (44 mL). Lifestyle  Take daily care of your teeth and gums.  Stay active. Exercise for at least 30 minutes on 5 or more days each week.  Do not use any products that contain nicotine or tobacco, such as cigarettes, e-cigarettes, and chewing tobacco. If you need help quitting, ask your health care provider.  If you are sexually active, practice safe sex. Use a condom or other form of birth control (contraception) in order to prevent pregnancy and STIs (sexually transmitted infections). If you plan to become pregnant, see your health care provider for a preconception visit. What's next?  Visit your health care provider once a year for a well check visit.  Ask your health care provider how often you should have your eyes and teeth checked.  Stay up to date on all vaccines. This information is not intended to replace advice given to you by your health care provider. Make sure you discuss any questions you have with your health care provider. Document Revised: 10/13/2017 Document Reviewed: 10/13/2017 Elsevier Patient Education  2020 [employer].

## 2019-07-10 NOTE — Addendum Note (Signed)
Addended by: Crosby Oyster on: 07/10/2019 11:45 AM   Modules accepted: Orders

## 2019-07-11 LAB — COMPREHENSIVE METABOLIC PANEL
ALT: 11 IU/L (ref 0–32)
AST: 13 IU/L (ref 0–40)
Albumin/Globulin Ratio: 1.6 (ref 1.2–2.2)
Albumin: 4.1 g/dL (ref 3.9–5.0)
Alkaline Phosphatase: 59 IU/L (ref 48–121)
BUN/Creatinine Ratio: 20 (ref 9–23)
BUN: 14 mg/dL (ref 6–20)
Bilirubin Total: 0.2 mg/dL (ref 0.0–1.2)
CO2: 20 mmol/L (ref 20–29)
Calcium: 8.9 mg/dL (ref 8.7–10.2)
Chloride: 107 mmol/L — ABNORMAL HIGH (ref 96–106)
Creatinine, Ser: 0.69 mg/dL (ref 0.57–1.00)
GFR calc Af Amer: 142 mL/min/{1.73_m2} (ref >59)
GFR calc non Af Amer: 123 mL/min/{1.73_m2} (ref >59)
Globulin, Total: 2.5 g/dL (ref 1.5–4.5)
Glucose: 99 mg/dL (ref 65–99)
Potassium: 4.2 mmol/L (ref 3.5–5.2)
Sodium: 142 mmol/L (ref 134–144)
Total Protein: 6.6 g/dL (ref 6.0–8.5)

## 2019-07-11 LAB — CBC
Hematocrit: 40.3 % (ref 34.0–46.6)
Hemoglobin: 13.3 g/dL (ref 11.1–15.9)
MCH: 29.8 pg (ref 26.6–33.0)
MCHC: 33 g/dL (ref 31.5–35.7)
MCV: 90 fL (ref 79–97)
Platelets: 210 10*3/uL (ref 150–450)
RBC: 4.47 x10E6/uL (ref 3.77–5.28)
RDW: 12 % (ref 11.7–15.4)
WBC: 6 10*3/uL (ref 3.4–10.8)

## 2019-07-11 LAB — LIPID PANEL
Chol/HDL Ratio: 3 ratio (ref 0.0–4.4)
Cholesterol, Total: 171 mg/dL (ref 100–199)
HDL: 57 mg/dL (ref >39)
LDL Chol Calc (NIH): 105 mg/dL — ABNORMAL HIGH (ref 0–99)
Triglycerides: 44 mg/dL (ref 0–149)
VLDL Cholesterol Cal: 9 mg/dL (ref 5–40)

## 2019-07-11 LAB — HIV ANTIBODY (ROUTINE TESTING W REFLEX): HIV Screen 4th Generation wRfx: NONREACTIVE

## 2019-07-11 LAB — RPR: RPR Ser Ql: NONREACTIVE

## 2019-07-11 LAB — VITAMIN D 25 HYDROXY (VIT D DEFICIENCY, FRACTURES): Vit D, 25-Hydroxy: 42.6 ng/mL (ref 30.0–100.0)

## 2019-07-11 LAB — HEMOGLOBIN A1C
Est. average glucose Bld gHb Est-mCnc: 94 mg/dL
Hgb A1c MFr Bld: 4.9 % (ref 4.8–5.6)

## 2019-07-11 LAB — TSH: TSH: 3.02 u[IU]/mL (ref 0.450–4.500)

## 2019-07-11 LAB — HEPATITIS C ANTIBODY: Hep C Virus Ab: <0.1 s/co ratio (ref 0.0–0.9)

## 2019-07-11 LAB — HEPATITIS B SURFACE ANTIGEN: Hepatitis B Surface Ag: NEGATIVE

## 2019-07-12 LAB — IGP,CTNGTV,RFX APTIMA HPV ASCU
Chlamydia, Nuc. Acid Amp: NEGATIVE
Gonococcus, Nuc. Acid Amp: NEGATIVE
PAP Smear Comment: 0
Trich vag by NAA: NEGATIVE

## 2019-08-14 ENCOUNTER — Telehealth: Payer: Self-pay | Admitting: Primary Care

## 2019-08-14 NOTE — Telephone Encounter (Signed)
Patient called.  Patient needs a tb test done for work.  Patient needs it done by 10/24/19.  Please order test.

## 2019-08-14 NOTE — Telephone Encounter (Signed)
LVM to call office to schedule nurse visit TB test.

## 2019-08-14 NOTE — Telephone Encounter (Signed)
Please call patient and have set up a nurse visit appointment

## 2019-09-20 ENCOUNTER — Other Ambulatory Visit: Payer: Self-pay | Admitting: *Deleted

## 2019-09-20 ENCOUNTER — Encounter: Payer: Self-pay | Admitting: *Deleted

## 2019-09-20 DIAGNOSIS — L700 Acne vulgaris: Secondary | ICD-10-CM

## 2019-09-20 DIAGNOSIS — N946 Dysmenorrhea, unspecified: Secondary | ICD-10-CM

## 2019-09-20 MED ORDER — NORGESTIMATE-ETH ESTRADIOL 0.25-35 MG-MCG PO TABS: 1.0000 | ORAL_TABLET | Freq: Every day | ORAL | 3 refills | Status: DC

## 2019-09-24 IMAGING — MR MRI LUMBAR SPINE WITHOUT CONTRAST
5 series · 48 of 48 positions shown · non-contrast
Comparison: Lumbar radiographs dated 08/16/2018

CLINICAL DATA: Left lower extremity numbness and tingling and pain.

EXAM:
MRI LUMBAR SPINE WITHOUT CONTRAST
TECHNIQUE: Multiplanar, multisequence MR imaging of the lumbar spine was
performed. No intravenous contrast was administered.

[Series 3: T2 · sagittal · 4.0mm · 0.88mm/px · 6 of 12 slices shown (1 of 2)]
[im 1/12]
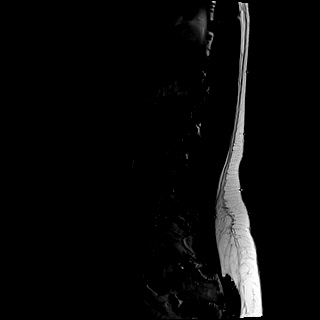
[im 3/12]
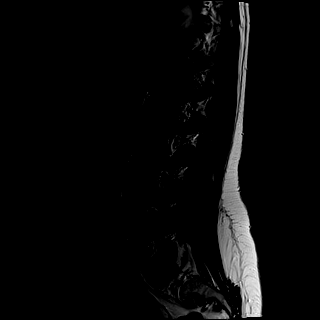
[im 5/12]
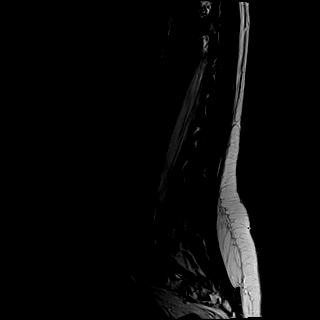
[im 7/12]
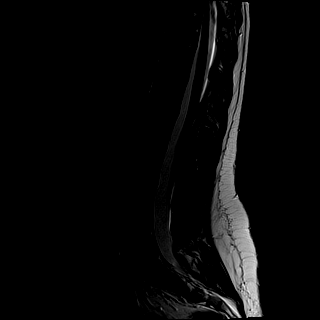
[im 9/12]
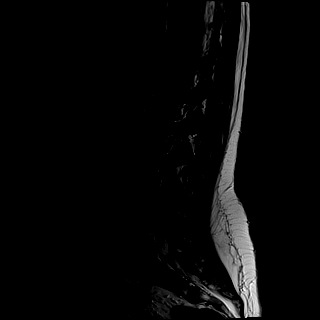
[im 12/12]
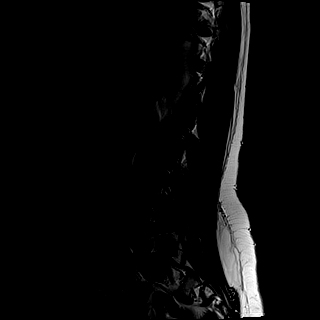

[Series 4: tirm sag · sagittal · 4.0mm · 0.88mm/px · 5 of 12 slices shown]
[im 1/12]
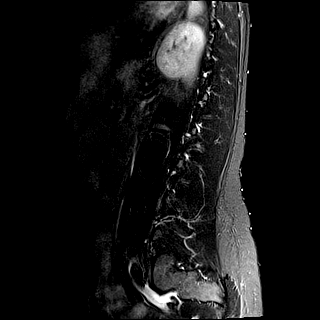
[im 3/12]
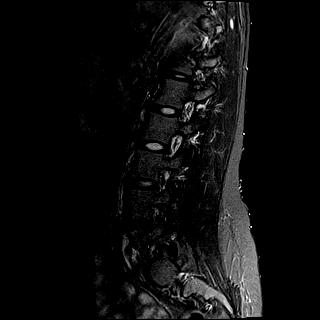
[im 6/12]
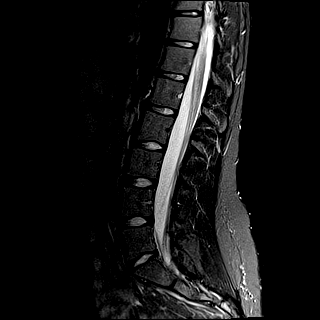
[im 9/12]
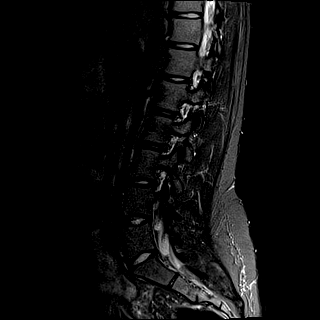
[im 12/12]
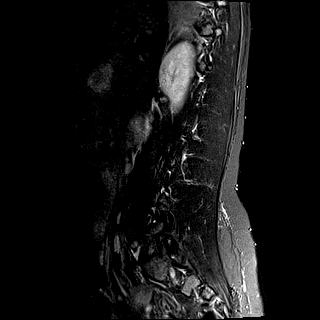

[Series 5: T1 · sagittal · 4.0mm · 0.88mm/px · 5 of 12 slices shown (1 of 2)]
[im 1/12]
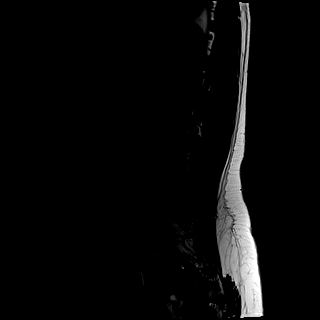
[im 3/12]
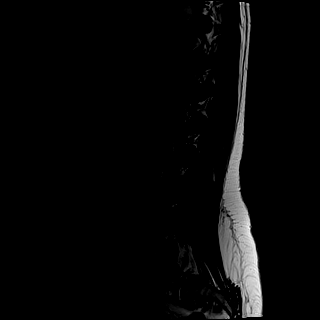
[im 6/12]
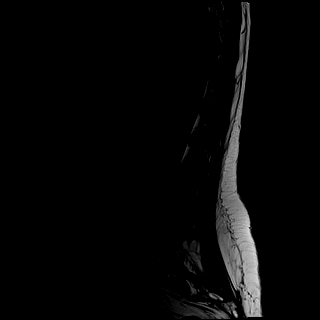
[im 9/12]
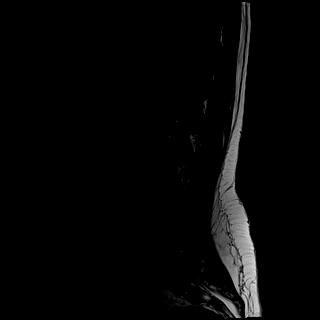
[im 12/12]
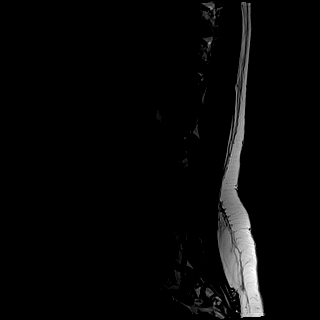

[Series 6: T2 · axial · 4.0mm · 0.70mm/px · z∈[-164,+41]mm · 16 of 37 slices shown (2 of 2)]
[im 1/37]
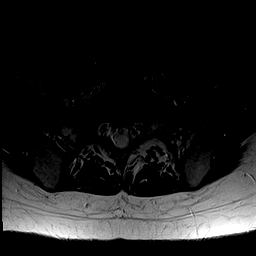
[im 3/37]
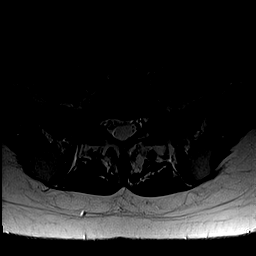
[im 5/37]
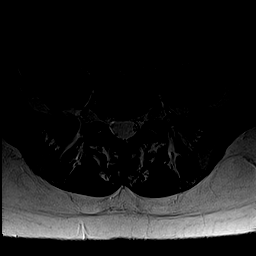
[im 8/37]
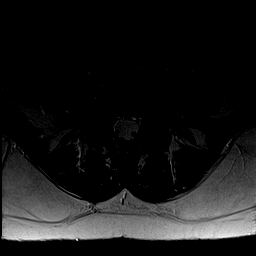
[im 10/37]
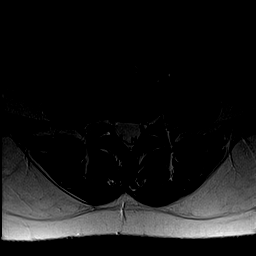
[im 13/37]
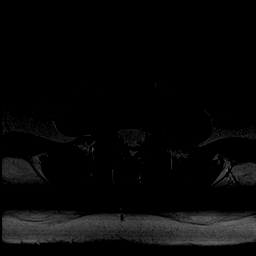
[im 15/37]
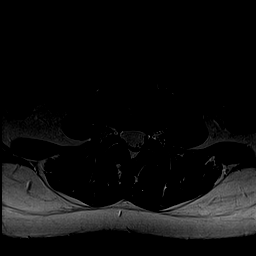
[im 17/37]
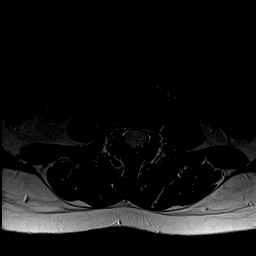
[im 20/37]
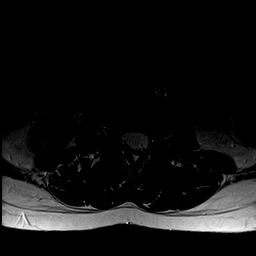
[im 22/37]
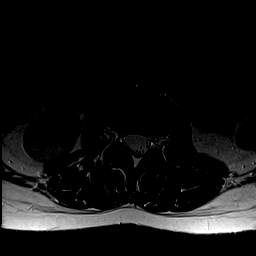
[im 25/37]
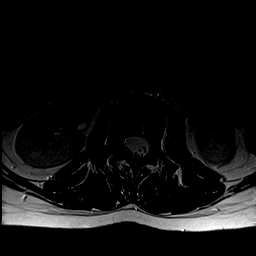
[im 27/37]
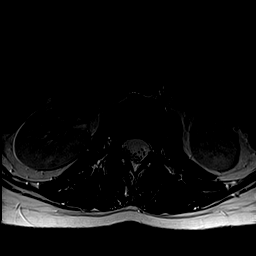
[im 29/37]
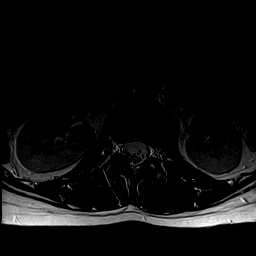
[im 32/37]
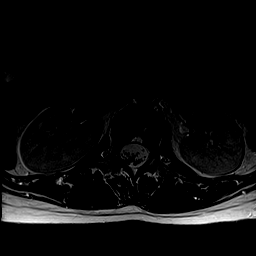
[im 34/37]
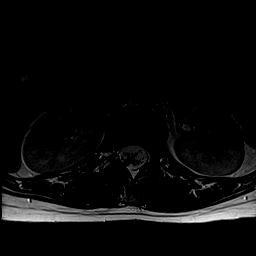
[im 37/37]
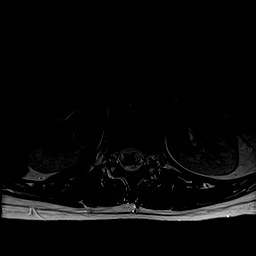

[Series 7: T1 · axial · 4.0mm · 0.70mm/px · z∈[-164,+41]mm · 16 of 37 slices shown (2 of 2)]
[im 1/37]
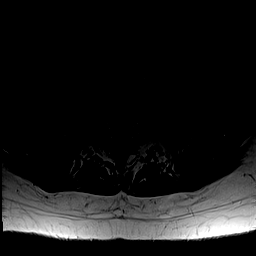
[im 3/37]
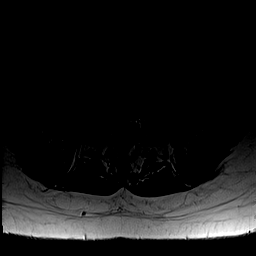
[im 5/37]
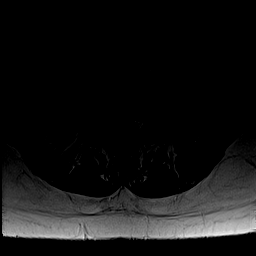
[im 8/37]
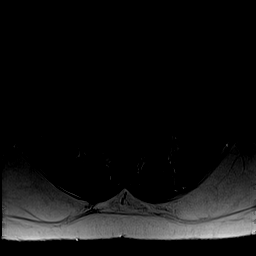
[im 10/37]
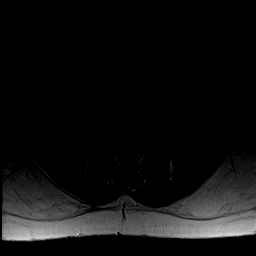
[im 13/37]
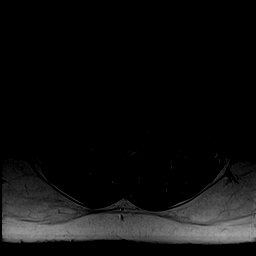
[im 15/37]
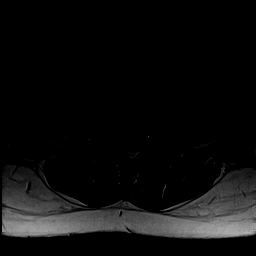
[im 17/37]
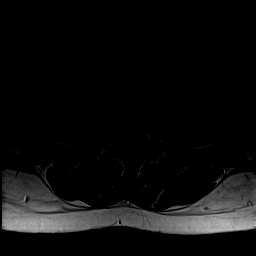
[im 20/37]
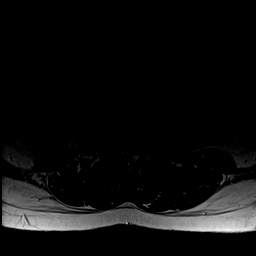
[im 22/37]
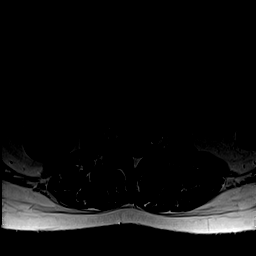
[im 25/37]
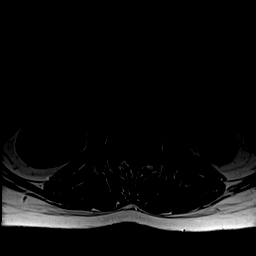
[im 27/37]
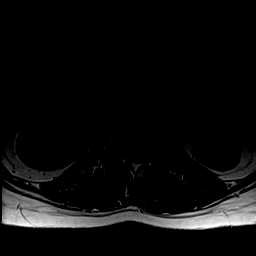
[im 29/37]
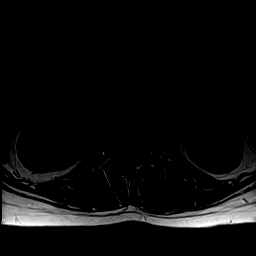
[im 32/37]
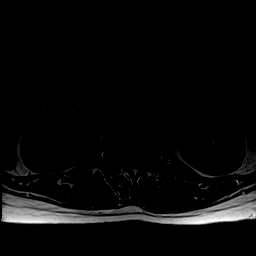
[im 34/37]
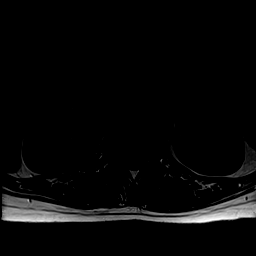
[im 37/37]
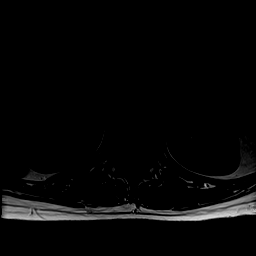

[48 of 48 positions shown; findings below may reference images not displayed]

FINDINGS: Segmentation:  Standard.

Alignment: Lateral alignment is normal. Minimal lumbar scoliosis
with convexity to the left centered at L2.

Vertebrae:  No fracture, evidence of discitis, or bone lesion.

Conus medullaris and cauda equina: Conus extends to the L2 level.
Conus and cauda equina appear normal.

Paraspinal and other soft tissues: Negative.

Disc levels:

T12-L1: Normal.

L1-2: Normal.

L2-3: Normal.

L3-4: Normal.

L4-5: Normal.

L5-S1: Normal.
IMPRESSION: Essentially normal MRI of the lumbar spine.

## 2019-09-28 ENCOUNTER — Telehealth: Payer: Self-pay | Admitting: Primary Care

## 2019-09-28 NOTE — Telephone Encounter (Signed)
Spoken to patient and inform her that I was unable to print the lab for patient. It was not done in this office and we are not allowed to print lab results from OB/GYN  Patient verbalized understanding

## 2019-09-28 NOTE — Telephone Encounter (Signed)
Patient called to cancel her nurse visit next week.  Patient had tb test done at [facility] on 09/19/19.  Patient would like a copy of her Hepatits B immunization records only.  She'd like to pick it up today.  Please call patient when ready.

## 2019-10-03 ENCOUNTER — Ambulatory Visit: Payer: Managed Care, Other (non HMO)

## 2019-10-17 ENCOUNTER — Ambulatory Visit: Payer: Managed Care, Other (non HMO)

## 2019-10-31 DIAGNOSIS — R5383 Other fatigue: Secondary | ICD-10-CM

## 2019-10-31 DIAGNOSIS — Z8349 Family history of other endocrine, nutritional and metabolic diseases: Secondary | ICD-10-CM

## 2019-11-02 ENCOUNTER — Other Ambulatory Visit: Payer: Self-pay

## 2019-11-02 ENCOUNTER — Other Ambulatory Visit (INDEPENDENT_AMBULATORY_CARE_PROVIDER_SITE_OTHER): Payer: Managed Care, Other (non HMO)

## 2019-11-02 ENCOUNTER — Encounter: Payer: Managed Care, Other (non HMO) | Admitting: Primary Care

## 2019-11-02 DIAGNOSIS — R5383 Other fatigue: Secondary | ICD-10-CM | POA: Diagnosis not present

## 2019-11-02 DIAGNOSIS — Z8349 Family history of other endocrine, nutritional and metabolic diseases: Secondary | ICD-10-CM

## 2019-11-04 LAB — THYROID PEROXIDASE ANTIBODY: Thyroperoxidase Ab SerPl-aCnc: <8 IU/mL (ref 0–34)

## 2019-11-04 LAB — T4, FREE: Free T4: 1.41 ng/dL (ref 0.82–1.77)

## 2019-11-04 LAB — T3, FREE: T3, Free: 3.8 pg/mL (ref 2.0–4.4)

## 2019-11-04 LAB — TSH: TSH: 4.7 u[IU]/mL — ABNORMAL HIGH (ref 0.450–4.500)

## 2019-11-09 ENCOUNTER — Ambulatory Visit: Payer: Managed Care, Other (non HMO) | Admitting: Primary Care

## 2019-11-09 ENCOUNTER — Encounter: Payer: Self-pay | Admitting: Primary Care

## 2019-11-09 ENCOUNTER — Other Ambulatory Visit: Payer: Self-pay | Admitting: Primary Care

## 2019-11-09 ENCOUNTER — Other Ambulatory Visit: Payer: Self-pay

## 2019-11-09 VITALS — BP 110/62 | HR 90 | Temp 96.4°F | Ht 63.0 in | Wt 124.0 lb

## 2019-11-09 DIAGNOSIS — R200 Anesthesia of skin: Secondary | ICD-10-CM

## 2019-11-09 DIAGNOSIS — R5383 Other fatigue: Secondary | ICD-10-CM | POA: Diagnosis not present

## 2019-11-09 DIAGNOSIS — R519 Headache, unspecified: Secondary | ICD-10-CM | POA: Insufficient documentation

## 2019-11-09 DIAGNOSIS — Z23 Encounter for immunization: Secondary | ICD-10-CM

## 2019-11-09 DIAGNOSIS — N921 Excessive and frequent menstruation with irregular cycle: Secondary | ICD-10-CM

## 2019-11-09 DIAGNOSIS — Z0001 Encounter for general adult medical examination with abnormal findings: Secondary | ICD-10-CM | POA: Insufficient documentation

## 2019-11-09 DIAGNOSIS — Z0000 Encounter for general adult medical examination without abnormal findings: Secondary | ICD-10-CM | POA: Diagnosis not present

## 2019-11-09 MED ORDER — PROPRANOLOL HCL ER 80 MG PO CP24: 80.0000 mg | ORAL_CAPSULE | Freq: Every day | ORAL | 0 refills | Status: DC

## 2019-11-09 NOTE — Assessment & Plan Note (Signed)
Chronic and continued, work up grossly unremarkable. Consider frequent headaches vs stress at etiology. Will treat headaches.

## 2019-11-09 NOTE — Progress Notes (Signed)
Subjective:    Patient ID: Jaclyn Bell, female    DOB: 1995/03/07, 24 y.o.   MRN: [id number]  HPI  This visit occurred during the SARS-CoV-2 public health emergency.  Safety protocols were in place, including screening questions prior to the visit, additional usage of staff PPE, and extensive cleaning of exam room while observing appropriate contact time as indicated for disinfecting solutions.   Jaclyn Bell is a 24 year old female who presents today for complete physical.  She is very concerned about hair loss, fatigue, constipation, little energy to spend time with her friends. She did have a panic attack earlier this year. She did complete an eight week exercise program which helped some with fatigue and stress. Recent TSH of 4.7 with Free T4 of 1.41, negative TPO antibodies. Family history of thyroid disorder in maternal aunt and cousin.   She would also like to discuss frequent headaches that are located to the bilateral frontal lobes and the base of her neck. Chronic headaches for years, strong family history of migraines. She's experiencing headaches three times weekly and migraines every few months. During migraines she will experience photophobia, phonophobia, nausea. She's taking Excedrin Migraine, Tylenol, Ibuprofen several days weekly for headaches, will take naproxen on occasion for migraines. She works at a [employer] and is constantly looking down.  Immunizations: -Tetanus: Completed in 2018 -Influenza: Due -Covid-19: Completed series -HPV: Completed series   Diet: She endorses a healthy diet.  Exercise: No regular exercise   Eye exam: Completed in 2021 Dental exam: Completes semi-annually   Pap Smear: Completed in 2021  BP Readings from Last 3 Encounters:  11/09/19 110/62  07/10/19 117/80  08/16/18 116/80     Review of Systems  Constitutional: Positive for fatigue. Negative for unexpected weight change.  HENT: Negative for rhinorrhea.   Eyes: Negative for  visual disturbance.  Respiratory: Negative for cough and shortness of breath.   Cardiovascular: Negative for chest pain.  Gastrointestinal: Positive for constipation. Negative for diarrhea.  Genitourinary: Negative for difficulty urinating and menstrual problem.  Musculoskeletal: Negative for arthralgias and myalgias.  Skin: Negative for rash.  Allergic/Immunologic: Positive for environmental allergies.  Neurological: Negative for dizziness, numbness and headaches.  Hematological: Negative for adenopathy.  Psychiatric/Behavioral:       Intermittent stress       Past Medical History:  Diagnosis Date  . Genital herpes simplex type 1 infection 03/18/2017     Social History   Socioeconomic History  . Marital status: Single    Spouse name: Not on file  . Number of children: Not on file  . Years of education: Not on file  . Highest education level: Not on file  Occupational History  . Not on file  Tobacco Use  . Smoking status: Never Smoker  . Smokeless tobacco: Never Used  Vaping Use  . Vaping Use: Never used  Substance and Sexual Activity  . Alcohol use: Yes    Comment: occ  . Drug use: No  . Sexual activity: Yes    Birth control/protection: Pill  Other Topics Concern  . 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   Lives with her mother and younger sister.   Enjoys shopping.   Social Determinants of Health   Financial Resource Strain:   . Difficulty of Paying Living Expenses: Not on file  Food Insecurity:   . Worried About Charity fundraiser in the Last Year:  Not on file  . Ran Out of Food in the Last Year: Not on file  Transportation Needs:   . Lack of Transportation (Medical): Not on file  . Lack of Transportation (Non-Medical): Not on file  Physical Activity:   . Days of Exercise per Week: Not on file  . Minutes of Exercise per Session: Not on file  Stress:   . Feeling of Stress : Not on file  Social  Connections:   . Frequency of Communication with Friends and Family: Not on file  . Frequency of Social Gatherings with Friends and Family: Not on file  . Attends Religious Services: Not on file  . Active Member of Clubs or Organizations: Not on file  . Attends Archivist Meetings: Not on file  . Marital Status: Not on file  Intimate Partner Violence:   . Fear of Current or Ex-Partner: Not on file  . Emotionally Abused: Not on file  . Physically Abused: Not on file  . Sexually Abused: Not on file    Past Surgical History:  Procedure Laterality Date  . FOOT SURGERY     pin in place  . TONSILLECTOMY AND ADENOIDECTOMY    . TUBES IN EARS Bilateral     Family History  Problem Relation Age of Onset  . Hypertension Mother   . Mental illness Father   . Cancer Maternal Grandmother     No Known Allergies  Current Outpatient Medications on File Prior to Visit  Medication Sig Dispense Refill  . naproxen (NAPROSYN) 500 MG tablet Take 1 tablet (500 mg total) by mouth 2 (two) times daily with a meal. As needed for pain 60 tablet 2  . norgestimate-ethinyl estradiol (ORTHO-CYCLEN) 0.25-35 MG-MCG tablet Take 1 tablet by mouth daily. Take only the active hormonal pills continuously as directed 84 tablet 3   No current facility-administered medications on file prior to visit.    BP 110/62   Pulse 90   Temp (!) 96.4 F (35.8 C) (Temporal)   Ht 5\' 3"  (1.6 m)   Wt 124 lb (56.2 kg)   SpO2 97%   BMI 21.97 kg/m    Objective:   Physical Exam HENT:     Right Ear: Tympanic membrane and ear canal normal.     Left Ear: Tympanic membrane and ear canal normal.  Eyes:     Pupils: Pupils are equal, round, and reactive to light.  Cardiovascular:     Rate and Rhythm: Normal rate and regular rhythm.  Pulmonary:     Effort: Pulmonary effort is normal.     Breath sounds: Normal breath sounds.  Abdominal:     General: Bowel sounds are normal.     Palpations: Abdomen is soft.      Tenderness: There is no abdominal tenderness.  Musculoskeletal:        General: Normal range of motion.     Cervical back: Neck supple.  Skin:    General: Skin is warm and dry.  Neurological:     Mental Status: She is alert and oriented to person, place, and time.     Cranial Nerves: No cranial nerve deficit.     Deep Tendon Reflexes:     Reflex Scores:      Patellar reflexes are 2+ on the right side and 2+ on the left side. Psychiatric:        Mood and Affect: Mood normal.            Assessment & Plan:

## 2019-11-09 NOTE — Patient Instructions (Signed)
Start propranolol ER 80 mg daily for headache prevention. Take 1 tablet by mouth every morning.   Continue to work on a healthy diet.  Start exercising. You should be getting 150 minutes of moderate intensity exercise weekly.  Please update me regarding your headaches in two weeks.  It was a pleasure to see you today!   Preventive Care 80-24 Years Old, Female Preventive care refers to visits with your health care provider and lifestyle choices that can promote health and wellness. This includes:  A yearly physical exam. This may also be called an annual well check.  Regular dental visits and eye exams.  Immunizations.  Screening for certain conditions.  Healthy lifestyle choices, such as eating a healthy diet, getting regular exercise, not using drugs or products that contain nicotine and tobacco, and limiting alcohol use. What can I expect for my preventive care visit? Physical exam Your health care provider will check your:  Height and weight. This may be used to calculate body mass index (BMI), which tells if you are at a healthy weight.  Heart rate and blood pressure.  Skin for abnormal spots. Counseling Your health care provider may ask you questions about your:  Alcohol, tobacco, and drug use.  Emotional well-being.  Home and relationship well-being.  Sexual activity.  Eating habits.  Work and work Statistician.  Method of birth control.  Menstrual cycle.  Pregnancy history. What immunizations do I need?  Influenza (flu) vaccine  This is recommended every year. Tetanus, diphtheria, and pertussis (Tdap) vaccine  You may need a Td booster every 10 years. Varicella (chickenpox) vaccine  You may need this if you have not been vaccinated. Human papillomavirus (HPV) vaccine  If recommended by your health care provider, you may need three doses over 6 months. Measles, mumps, and rubella (MMR) vaccine  You may need at least one dose of MMR. You may  also need a second dose. Meningococcal conjugate (MenACWY) vaccine  One dose is recommended if you are age 81-21 years and a first-year college student living in a residence hall, or if you have one of several medical conditions. You may also need additional booster doses. Pneumococcal conjugate (PCV13) vaccine  You may need this if you have certain conditions and were not previously vaccinated. Pneumococcal polysaccharide (PPSV23) vaccine  You may need one or two doses if you smoke cigarettes or if you have certain conditions. Hepatitis A vaccine  You may need this if you have certain conditions or if you travel or work in places where you may be exposed to hepatitis A. Hepatitis B vaccine  You may need this if you have certain conditions or if you travel or work in places where you may be exposed to hepatitis B. Haemophilus influenzae type b (Hib) vaccine  You may need this if you have certain conditions. You may receive vaccines as individual doses or as more than one vaccine together in one shot (combination vaccines). Talk with your health care provider about the risks and benefits of combination vaccines. What tests do I need?  Blood tests  Lipid and cholesterol levels. These may be checked every 5 years starting at age 75.  Hepatitis C test.  Hepatitis B test. Screening  Diabetes screening. This is done by checking your blood sugar (glucose) after you have not eaten for a while (fasting).  Sexually transmitted disease (STD) testing.  BRCA-related cancer screening. This may be done if you have a family history of breast, ovarian, tubal, or peritoneal cancers.  Pelvic exam and Pap test. This may be done every 3 years starting at age 19. Starting at age 73, this may be done every 5 years if you have a Pap test in combination with an HPV test. Talk with your health care provider about your test results, treatment options, and if necessary, the need for more tests. Follow  these instructions at home: Eating and drinking   Eat a diet that includes fresh fruits and vegetables, whole grains, lean protein, and low-fat dairy.  Take vitamin and mineral supplements as recommended by your health care provider.  Do not drink alcohol if: ? Your health care provider tells you not to drink. ? You are pregnant, may be pregnant, or are planning to become pregnant.  If you drink alcohol: ? Limit how much you have to 0-1 drink a day. ? Be aware of how much alcohol is in your drink. In the [place] one drink equals one 12 oz bottle of beer (355 mL), one 5 oz glass of wine (148 mL), or one 1 oz glass of hard liquor (44 mL). Lifestyle  Take daily care of your teeth and gums.  Stay active. Exercise for at least 30 minutes on 5 or more days each week.  Do not use any products that contain nicotine or tobacco, such as cigarettes, e-cigarettes, and chewing tobacco. If you need help quitting, ask your health care provider.  If you are sexually active, practice safe sex. Use a condom or other form of birth control (contraception) in order to prevent pregnancy and STIs (sexually transmitted infections). If you plan to become pregnant, see your health care provider for a preconception visit. What's next?  Visit your health care provider once a year for a well check visit.  Ask your health care provider how often you should have your eyes and teeth checked.  Stay up to date on all vaccines. This information is not intended to replace advice given to you by your health care provider. Make sure you discuss any questions you have with your health care provider. Document Revised: 10/13/2017 Document Reviewed: 10/13/2017 Elsevier Patient Education  2020 [employer].

## 2019-11-09 NOTE — Assessment & Plan Note (Signed)
Following with GYN, taking OCP's consistently for three months with a menstrual cycle after three months.   Continue same.

## 2019-11-09 NOTE — Assessment & Plan Note (Signed)
Chronic for years and occurring 3-4 times weekly, strong family history of migraines.   Discussed options, will initiate treatment for headache prevention with propranolol ER 80 mg. She will update in a few weeks.

## 2019-11-09 NOTE — Assessment & Plan Note (Signed)
Influenza vaccine provided today. Pap smear UTD. Encouraged a healthy diet, regular exericse. Exam today stable. Labs reviewed.

## 2019-11-09 NOTE — Assessment & Plan Note (Signed)
Improved, negative neurology work up. Working on posture when standing.

## 2019-12-06 ENCOUNTER — Other Ambulatory Visit: Payer: Self-pay | Admitting: Primary Care

## 2019-12-06 DIAGNOSIS — R519 Headache, unspecified: Secondary | ICD-10-CM

## 2020-02-29 ENCOUNTER — Telehealth: Payer: Self-pay

## 2020-02-29 NOTE — Telephone Encounter (Signed)
Called patient she will try nurse line and let our office know if any issues.

## 2020-02-29 NOTE — Telephone Encounter (Signed)
On her period. Started having vaginal discomfort and swelling into 1-11 into 1-12. Usually wears [employer] Was unable to wear tampons. Has been wearing pads and it seems to have been getting better but burns when she wipes after voiding. Does not think she is having a Herpes outbreak. Does not think it is a yeast infection either. Her GYN is closed. Asking for some guidance. Please advise [phone]

## 2020-02-29 NOTE — Telephone Encounter (Signed)
I'm not sure either, it's very difficult to know without an exam. Does her GYN office have an after hours triage line? I recommend she try them.  I'm happy to see her in the office, but we won't open until Tuesday.

## 2020-03-11 ENCOUNTER — Telehealth: Payer: Self-pay

## 2020-03-11 ENCOUNTER — Other Ambulatory Visit: Payer: Self-pay

## 2020-03-11 DIAGNOSIS — N946 Dysmenorrhea, unspecified: Secondary | ICD-10-CM

## 2020-03-11 DIAGNOSIS — L700 Acne vulgaris: Secondary | ICD-10-CM

## 2020-03-11 MED ORDER — NORGESTIMATE-ETH ESTRADIOL 0.25-35 MG-MCG PO TABS: 1.0000 | ORAL_TABLET | Freq: Every day | ORAL | 3 refills | Status: DC

## 2020-03-11 NOTE — Telephone Encounter (Signed)
Pt called stating she needs refill on b/c, sent b/c into pharmacy.

## 2020-04-14 ENCOUNTER — Other Ambulatory Visit: Payer: Self-pay

## 2020-05-14 ENCOUNTER — Encounter: Payer: Self-pay | Admitting: Radiology

## 2020-06-04 ENCOUNTER — Other Ambulatory Visit: Payer: Self-pay | Admitting: Obstetrics & Gynecology

## 2020-06-04 DIAGNOSIS — N946 Dysmenorrhea, unspecified: Secondary | ICD-10-CM

## 2020-06-04 DIAGNOSIS — L700 Acne vulgaris: Secondary | ICD-10-CM

## 2020-06-04 MED ORDER — NORGESTIMATE-ETH ESTRADIOL 0.25-35 MG-MCG PO TABS
ORAL_TABLET | ORAL | 3 refills | Status: DC
Start: 2020-06-04 — End: 2020-12-01

## 2020-07-28 ENCOUNTER — Ambulatory Visit: Payer: Managed Care, Other (non HMO) | Admitting: Obstetrics & Gynecology

## 2020-07-29 ENCOUNTER — Other Ambulatory Visit: Payer: Self-pay

## 2020-07-29 ENCOUNTER — Encounter: Payer: Self-pay | Admitting: Obstetrics & Gynecology

## 2020-07-29 ENCOUNTER — Ambulatory Visit (INDEPENDENT_AMBULATORY_CARE_PROVIDER_SITE_OTHER): Payer: Managed Care, Other (non HMO) | Admitting: Obstetrics & Gynecology

## 2020-07-29 VITALS — BP 123/78 | HR 84 | Wt 142.0 lb

## 2020-07-29 DIAGNOSIS — Z3041 Encounter for surveillance of contraceptive pills: Secondary | ICD-10-CM | POA: Diagnosis not present

## 2020-07-29 DIAGNOSIS — T7840XA Allergy, unspecified, initial encounter: Secondary | ICD-10-CM | POA: Diagnosis not present

## 2020-07-29 DIAGNOSIS — Z113 Encounter for screening for infections with a predominantly sexual mode of transmission: Secondary | ICD-10-CM

## 2020-07-29 DIAGNOSIS — Z01419 Encounter for gynecological examination (general) (routine) without abnormal findings: Secondary | ICD-10-CM

## 2020-07-29 NOTE — Patient Instructions (Signed)
Try hypoallergenic menstrual products. Can try menstrual cups or discs.  Preventive Care 20-25 Years Old, Female Preventive care refers to lifestyle choices and visits with your health care provider that can promote health and wellness. This includes: A yearly physical exam. This is also called an annual wellness visit. Regular dental and eye exams. Immunizations. Screening for certain conditions. Healthy lifestyle choices, such as: Eating a healthy diet. Getting regular exercise. Not using drugs or products that contain nicotine and tobacco. Limiting alcohol use. What can I expect for my preventive care visit? Physical exam Your health care provider may check your: Height and weight. These may be used to calculate your BMI (body mass index). BMI is a measurement that tells if you are at a healthy weight. Heart rate and blood pressure. Body temperature. Skin for abnormal spots. Counseling Your health care provider may ask you questions about your: Past medical problems. Family's medical history. Alcohol, tobacco, and drug use. Emotional well-being. Home life and relationship well-being. Sexual activity. Diet, exercise, and sleep habits. Work and work Statistician. Access to firearms. Method of birth control. Menstrual cycle. Pregnancy history. What immunizations do I need?  Vaccines are usually given at various ages, according to a schedule. Your health care provider will recommend vaccines for you based on your age, medicalhistory, and lifestyle or other factors, such as travel or where you work. What tests do I need?  Blood tests Lipid and cholesterol levels. These may be checked every 5 years starting at age 82. Hepatitis C test. Hepatitis B test. Screening Diabetes screening. This is done by checking your blood sugar (glucose) after you have not eaten for a while (fasting). STD (sexually transmitted disease) testing, if you are at risk. BRCA-related cancer screening.  This may be done if you have a family history of breast, ovarian, tubal, or peritoneal cancers. Pelvic exam and Pap test. This may be done every 3 years starting at age 58. Starting at age 57, this may be done every 5 years if you have a Pap test in combination with an HPV test. Talk with your health care provider about your test results, treatment options,and if necessary, the need for more tests. Follow these instructions at home: Eating and drinking  Eat a healthy diet that includes fresh fruits and vegetables, whole grains, lean protein, and low-fat dairy products. Take vitamin and mineral supplements as recommended by your health care provider. Do not drink alcohol if: Your health care provider tells you not to drink. You are pregnant, may be pregnant, or are planning to become pregnant. If you drink alcohol: Limit how much you have to 0-1 drink a day. Be aware of how much alcohol is in your drink. In the [place] one drink equals one 12 oz bottle of beer (355 mL), one 5 oz glass of wine (148 mL), or one 1 oz glass of hard liquor (44 mL).  Lifestyle Take daily care of your teeth and gums. Brush your teeth every morning and night with fluoride toothpaste. Floss one time each day. Stay active. Exercise for at least 30 minutes 5 or more days each week. Do not use any products that contain nicotine or tobacco, such as cigarettes, e-cigarettes, and chewing tobacco. If you need help quitting, ask your health care provider. Do not use drugs. If you are sexually active, practice safe sex. Use a condom or other form of protection to prevent STIs (sexually transmitted infections). If you do not wish to become pregnant, use a form of birth  control. If you plan to become pregnant, see your health care provider for a prepregnancy visit. Find healthy ways to cope with stress, such as: Meditation, yoga, or listening to music. Journaling. Talking to a trusted person. Spending time with friends and  family. Safety Always wear your seat belt while driving or riding in a vehicle. Do not drive: If you have been drinking alcohol. Do not ride with someone who has been drinking. When you are tired or distracted. While texting. Wear a helmet and other protective equipment during sports activities. If you have firearms in your house, make sure you follow all gun safety procedures. Seek help if you have been physically or sexually abused. What's next? Go to your health care provider once a year for an annual wellness visit. Ask your health care provider how often you should have your eyes and teeth checked. Stay up to date on all vaccines. This information is not intended to replace advice given to you by your health care provider. Make sure you discuss any questions you have with your healthcare provider. Document Revised: 09/30/2019 Document Reviewed: 10/13/2017 Elsevier Patient Education  2022 [employer].

## 2020-07-29 NOTE — Progress Notes (Signed)
GYNECOLOGY ANNUAL PREVENTATIVE CARE ENCOUNTER NOTE  History:     Jaclyn Bell is a 25 y.o. G0P0000 female here for a routine annual gynecologic exam.  Current complaints:  she reports having reactions to tampons and pads during her cycle, vagina gets very swollen, wants to discuss alternatives.   Denies abnormal vaginal bleeding, discharge, pelvic pain, problems with intercourse or other gynecologic concerns.    Gynecologic History Patient's last menstrual period was 07/09/2020 (exact date). Contraception: OCP (estrogen/progesterone) Last Pap: 07/10/19. Results were: normal with negative GC/Chlam   Obstetric History OB History  Gravida Para Term Preterm AB Living  0 0 0 0 0 0  SAB IAB Ectopic Multiple Live Births  0 0 0 0 0    Past Medical History:  Diagnosis Date   Genital herpes simplex type 1 infection 03/18/2017    Past Surgical History:  Procedure Laterality Date   FOOT SURGERY     pin in place   TONSILLECTOMY AND ADENOIDECTOMY     TUBES IN EARS Bilateral     Current Outpatient Medications on File Prior to Visit  Medication Sig Dispense Refill   naproxen (NAPROSYN) 500 MG tablet Take 1 tablet (500 mg total) by mouth 2 (two) times daily with a meal. As needed for pain 60 tablet 2   norgestimate-ethinyl estradiol (ESTARYLLA) 0.25-35 MG-MCG tablet TAKE 1 TABLET BY MOUTH DAILY. ONLY ACTIVE HORMONAL PILS CONTINUOUSLY 84 tablet 3   spironolactone (ALDACTONE) 25 MG tablet Take 25 mg by mouth daily.     tretinoin (RETIN-A) 0.025 % cream Apply pea-sized amount to face nightly     No current facility-administered medications on file prior to visit.    No Known Allergies  Social History:  reports that she has never smoked. She has never used smokeless tobacco. She reports current alcohol use. She reports that she does not use drugs.  Family History  Problem Relation Age of Onset   Hypertension Mother    Mental illness Father    Cancer Maternal Grandmother     The  following portions of the patient's history were reviewed and updated as appropriate: allergies, current medications, past family history, past medical history, past social history, past surgical history and problem list.  Review of Systems Pertinent items noted in HPI and remainder of comprehensive ROS otherwise negative.  Physical Exam:  BP 123/78   Pulse 84   Wt 142 lb (64.4 kg)   LMP 07/09/2020 (Exact Date)   BMI 25.15 kg/m  CONSTITUTIONAL: Well-developed, well-nourished female in no acute distress.  HENT:  Normocephalic, atraumatic, External right and left ear normal.  EYES: Conjunctivae and EOM are normal. Pupils are equal, round, and reactive to light. No scleral icterus.  NECK: Normal range of motion, supple, no masses.  Normal thyroid.  SKIN: Skin is warm and dry. No rash noted. Not diaphoretic. No erythema. No pallor. MUSCULOSKELETAL: Normal range of motion. No tenderness.  No cyanosis, clubbing, or edema. NEUROLOGIC: Alert and oriented to person, place, and time. Normal reflexes, muscle tone coordination.  PSYCHIATRIC: Normal mood and affect. Normal behavior. Normal judgment and thought content. CARDIOVASCULAR: Normal heart rate noted, regular rhythm RESPIRATORY: Clear to auscultation bilaterally. Effort and breath sounds normal, no problems with respiration noted. BREASTS: Symmetric in size. No masses, tenderness, skin changes, nipple drainage, or lymphadenopathy bilaterally. Performed in the presence of a chaperone. ABDOMEN: Soft, no distention noted.  No tenderness, rebound or guarding.  PELVIC: Normal appearing external genitalia and urethral meatus; normal appearing distal  vaginal mucosa and cervix.  No abnormal discharge noted.  Testing sample obtained.  No internal examination done.  Performed in the presence of a chaperone.   Assessment and Plan:    1. Allergic reaction, initial encounter Patient encouraged to try hypoallergenic menstrual products. Can try menstrual  cups or discs.  2. Uses oral contraception Satisfied with method, taking hormonal pills continuously.   3. Routine screening for STI (sexually transmitted infection) 4. Well woman exam with routine gynecological exam Labs done, will follow up results and manage accordingly. - RPR+HBsAg+HCVAb+... - GC/Chlamydia Probe Amp Up to date on pap smear screening, due 06/2022. Routine preventative health maintenance measures emphasized. Please refer to After Visit Summary for other counseling recommendations.      Verita Schneiders, MD, [place] [facility] [employer] [facility] [employer], [place] '

## 2020-07-30 LAB — RPR+HBSAG+HCVAB+...
HIV Screen 4th Generation wRfx: NONREACTIVE
Hep C Virus Ab: <0.1 s/co ratio (ref 0.0–0.9)
Hepatitis B Surface Ag: NEGATIVE
RPR Ser Ql: NONREACTIVE

## 2020-08-01 ENCOUNTER — Other Ambulatory Visit: Payer: Self-pay

## 2020-08-01 ENCOUNTER — Encounter: Payer: Self-pay | Admitting: Primary Care

## 2020-08-01 ENCOUNTER — Ambulatory Visit: Payer: Managed Care, Other (non HMO) | Admitting: Primary Care

## 2020-08-01 VITALS — BP 118/76 | HR 82 | Temp 97.6°F | Ht 63.0 in | Wt 141.0 lb

## 2020-08-01 DIAGNOSIS — R21 Rash and other nonspecific skin eruption: Secondary | ICD-10-CM | POA: Diagnosis not present

## 2020-08-01 DIAGNOSIS — F411 Generalized anxiety disorder: Secondary | ICD-10-CM

## 2020-08-01 DIAGNOSIS — L700 Acne vulgaris: Secondary | ICD-10-CM | POA: Diagnosis not present

## 2020-08-01 HISTORY — DX: Rash and other nonspecific skin eruption: R21

## 2020-08-01 LAB — GC/CHLAMYDIA PROBE AMP
Chlamydia trachomatis, NAA: NEGATIVE
Neisseria Gonorrhoeae by PCR: NEGATIVE

## 2020-08-01 MED ORDER — SERTRALINE HCL 50 MG PO TABS: 50.0000 mg | ORAL_TABLET | Freq: Every day | ORAL | 1 refills | Status: DC

## 2020-08-01 NOTE — Assessment & Plan Note (Signed)
Much improved since on OCP's and spironolactone 25 mg. Follows with dermatology.

## 2020-08-01 NOTE — Assessment & Plan Note (Signed)
Chronic for years, now uncontrolled.  Discussed options for treatment including therapy vs medication, she opts for medication.  Rx for Zoloft 50 mg sent to pharmacy. Patient is to take 1/2 tablet daily for 8 days, then advance to 1 full tablet thereafter. We discussed possible side effects of headache, GI upset, drowsiness, and SI/HI. If thoughts of SI/HI develop, we discussed to present to the emergency immediately. Patient verbalized understanding.   Follow up in 6 weeks for re-evaluation.

## 2020-08-01 NOTE — Patient Instructions (Signed)
We initiated sertraline (Zoloft) 50 mg daily for anxiety and depression. Start by taking 1/2 tablet daily for about one week, then increase to 1 full tablet thereafter.  Hold the spironolactone medication for 2 weeks.  Please schedule a follow up virtual or in person visit for 6 weeks for follow up of anxiety/depression.  It was a pleasure to see you today!

## 2020-08-01 NOTE — Assessment & Plan Note (Signed)
Unclear etiology, could be in response to spironolactone as this has been the only difference/change since symptoms began.  Continue antihistamine. Discussed insect repellant. She will notify dermatology. Hold spironolactone for 2 weeks.

## 2020-08-01 NOTE — Progress Notes (Signed)
Subjective:    Patient ID: Jaclyn Bell, female    DOB: [birth year], 25 y.o.   MRN: [id number]  HPI  Jaclyn Bell is a very pleasant 25 y.o. female with a history of fatigue, frequent headaches, lower extremity numbness who presents today to discuss anxiety and an insect bite.   1) GAD: Chronic history of anxiety, increased since Covid-19. 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   Did undergo therapy previously when her parents divorced. Has never tried medication. Family history of anxiety in mother who has recognized symptoms.   2) Rash: Acute since early April 2022, intermittent with red, raised, itchy, painful "bites to her extremities, face, ankles. The only changes she's noticed was the introduction of spironolactone in March 2022 for hair loss treatment per dermatology. She thinks these look like mosquito bites.    Review of Systems  Skin:  Positive for color change and rash.       Facial acne improving  Hematological:  Negative for adenopathy.  Psychiatric/Behavioral:  The patient is nervous/anxious.        See HPI        Past Medical History:  Diagnosis Date   Genital herpes simplex type 1 infection 03/18/2017    Social History   Socioeconomic History   Marital status: Single    Spouse name: Not on file   Number of children: Not on file   Years of education: Not on file   Highest education level: Not on file  Occupational History   Not on file  Tobacco Use   Smoking status: Never   Smokeless tobacco: Never  Vaping Use   Vaping Use: Never used  Substance and Sexual Activity   Alcohol use: Yes    Comment: occ   Drug use: No   Sexual activity: Yes    Birth control/protection: Pill  Other Topics Concern   Not on file  Social History Narrative   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   Lives with her mother and younger sister.   Enjoys shopping.   Social Determinants of Health   Financial Resource Strain: Not on file  Food Insecurity: Not on file  Transportation Needs: Not on file  Physical Activity: Not on file  Stress: Not on file  Social Connections: Not on file  Intimate Partner Violence: Not on file    Past Surgical History:  Procedure Laterality Date   FOOT SURGERY     pin in place   TONSILLECTOMY AND ADENOIDECTOMY     TUBES IN EARS Bilateral     Family History  Problem Relation Age of Onset   Hypertension Mother    Mental illness Father    Cancer Maternal Grandmother     No Known Allergies  Current Outpatient Medications on File Prior to Visit  Medication Sig Dispense Refill   naproxen (NAPROSYN) 500 MG tablet Take 1 tablet (500 mg total) by mouth 2 (two) times daily with a meal. As needed for pain 60 tablet 2   norgestimate-ethinyl estradiol (ESTARYLLA) 0.25-35 MG-MCG tablet TAKE 1 TABLET BY MOUTH DAILY. ONLY ACTIVE HORMONAL PILS CONTINUOUSLY 84 tablet 3   spironolactone (ALDACTONE) 25 MG tablet Take 25 mg by mouth daily.     tretinoin (RETIN-A) 0.025 % cream Apply pea-sized amount to face nightly     No current facility-administered medications on  file prior to visit.    BP 118/76   Pulse 82   Temp 97.6 F (36.4 C) (Temporal)   Ht 5\' 3"  (1.6 m)   Wt 141 lb (64 kg)   LMP 07/09/2020 (Exact Date)   SpO2 98%   BMI 24.98 kg/m  Objective:   Physical Exam Cardiovascular:     Rate and Rhythm: Normal rate and regular rhythm.  Pulmonary:     Effort: Pulmonary effort is normal.     Breath sounds: Normal breath sounds.  Musculoskeletal:     Cervical back: Neck supple.  Skin:    General: Skin is warm and dry.     Comments: Numerous rounded, raised, red, large spots to lower and upper extremities.           Assessment & Plan:      This visit occurred during the SARS-CoV-2 public health emergency.  Safety  protocols were in place, including screening questions prior to the visit, additional usage of staff PPE, and extensive cleaning of exam room while observing appropriate contact time as indicated for disinfecting solutions.

## 2020-09-12 ENCOUNTER — Ambulatory Visit: Payer: Managed Care, Other (non HMO) | Admitting: Primary Care

## 2020-09-19 ENCOUNTER — Other Ambulatory Visit: Payer: Self-pay

## 2020-09-19 ENCOUNTER — Encounter: Payer: Self-pay | Admitting: Primary Care

## 2020-09-19 ENCOUNTER — Ambulatory Visit: Payer: Managed Care, Other (non HMO) | Admitting: Primary Care

## 2020-09-19 DIAGNOSIS — F411 Generalized anxiety disorder: Secondary | ICD-10-CM | POA: Diagnosis not present

## 2020-09-19 NOTE — Progress Notes (Signed)
Subjective:    Patient ID: Jaclyn Bell, female    DOB: 13-Nov-1995, 25 y.o.   MRN: [id number]  HPI  Jaclyn Bell is a very pleasant 25 y.o. female with a history of frequent headaches, GAD, fatigue who presents today for follow up of anxiety.   She was last evaluated on 08/01/20, discussed chronic anxiety that had increased since Covid-19 pandemic in 2020. Symptoms of panic attacks, feeling anxious, nervous about being in public, etc. Given her symptoms we decided to proceed with medication treatment, Rx for Zoloft 50 mg sent to pharmacy. She is here today for follow up.   Since her last visit she's feeling better. Positive effects include feeling more relaxed, less anxious, less nervous about germs. She's currently taking 25 mg as this causes drowsiness with any form of rest. She tried taking this at night which causes her to sleep deeply. She's has not taken her sertraline this week, has noticed a slight return in symptoms.   She would like to try taking her medication at night. She has plenty of medication for now. She denies nausea, upset stomach, diarrhea, SI/HI.   Review of Systems  Cardiovascular:  Negative for chest pain.  Gastrointestinal:  Negative for diarrhea and nausea.  Psychiatric/Behavioral:  The patient is nervous/anxious.        See HPI        Past Medical History:  Diagnosis Date   Genital herpes simplex type 1 infection 03/18/2017    Social History   Socioeconomic History   Marital status: Single    Spouse name: Not on file   Number of children: Not on file   Years of education: Not on file   Highest education level: Not on file  Occupational History   Not on file  Tobacco Use   Smoking status: Never   Smokeless tobacco: Never  Vaping Use   Vaping Use: Never used  Substance and Sexual Activity   Alcohol use: Yes    Comment: occ   Drug use: No   Sexual activity: Yes    Birth control/protection: Pill  Other Topics Concern   Not on file  Social  History Narrative   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   Lives with her mother and younger sister.   Enjoys shopping.   Social Determinants of Health   Financial Resource Strain: Not on file  Food Insecurity: Not on file  Transportation Needs: Not on file  Physical Activity: Not on file  Stress: Not on file  Social Connections: Not on file  Intimate Partner Violence: Not on file    Past Surgical History:  Procedure Laterality Date   FOOT SURGERY     pin in place   TONSILLECTOMY AND ADENOIDECTOMY     TUBES IN EARS Bilateral     Family History  Problem Relation Age of Onset   Hypertension Mother    Mental illness Father    Cancer Maternal Grandmother     No Known Allergies  Current Outpatient Medications on File Prior to Visit  Medication Sig Dispense Refill   naproxen (NAPROSYN) 500 MG tablet Take 1 tablet (500 mg total) by mouth 2 (two) times daily with a meal. As needed for pain 60 tablet 2   norgestimate-ethinyl estradiol (ESTARYLLA) 0.25-35 MG-MCG tablet TAKE 1 TABLET BY MOUTH DAILY. ONLY ACTIVE HORMONAL PILS CONTINUOUSLY 84 tablet 3   sertraline (ZOLOFT) 50 MG tablet Take 1 tablet (50 mg total) by mouth daily. For anxiety. (  Patient taking differently: Take 50 mg by mouth daily. 1/2 tab For anxiety.) 30 tablet 1   spironolactone (ALDACTONE) 25 MG tablet Take 25 mg by mouth daily.     tretinoin (RETIN-A) 0.025 % cream Apply pea-sized amount to face nightly     No current facility-administered medications on file prior to visit.    BP 110/62   Pulse 75   Temp 97.7 F (36.5 C) (Temporal)   Ht '5\' 3"'$  (1.6 m)   Wt 144 lb (65.3 kg)   SpO2 99%   BMI 25.51 kg/m  Objective:   Physical Exam Cardiovascular:     Rate and Rhythm: Normal rate and regular rhythm.  Pulmonary:     Effort: Pulmonary effort is normal.     Breath sounds: Normal breath sounds.  Musculoskeletal:     Cervical back: Neck supple.  Skin:    General: Skin is warm and dry.   Psychiatric:        Mood and Affect: Mood normal.          Assessment & Plan:      This visit occurred during the SARS-CoV-2 public health emergency.  Safety protocols were in place, including screening questions prior to the visit, additional usage of staff PPE, and extensive cleaning of exam room while observing appropriate contact time as indicated for disinfecting solutions.

## 2020-09-19 NOTE — Patient Instructions (Signed)
Continue taking sertraline (Zoloft) 25 mg (1/2 tablet) for anxiety. Take at bedtime. Move to dinner time if you feel drowsy the following day.  It was a pleasure to see you today!

## 2020-09-19 NOTE — Assessment & Plan Note (Signed)
Improved on sertraline 25 mg, is causing drowsiness, but she would like to resume and take at bedtime.   Continue sertraline 25 mg, move to bedtime. She will return in late September for CPE and follow up.

## 2020-09-22 ENCOUNTER — Other Ambulatory Visit: Payer: Self-pay

## 2020-09-22 ENCOUNTER — Ambulatory Visit
Admission: RE | Admit: 2020-09-22 | Discharge: 2020-09-22 | Disposition: A | Discharge status: Home / Self Care | Payer: Managed Care, Other (non HMO) | Source: Ambulatory Visit | Attending: Emergency Medicine | Admitting: Emergency Medicine

## 2020-09-22 VITALS — BP 122/88 | HR 73 | Temp 98.5°F | Resp 18

## 2020-09-22 DIAGNOSIS — H60501 Unspecified acute noninfective otitis externa, right ear: Secondary | ICD-10-CM | POA: Diagnosis not present

## 2020-09-22 MED ORDER — NEOMYCIN-POLYMYXIN-HC 3.5-10000-1 OT SUSP: 4.0000 [drp] | Freq: Three times a day (TID) | OTIC | 0 refills | Status: DC

## 2020-09-22 NOTE — ED Triage Notes (Signed)
Pt said she has been having ear pain on the right ear. Pt said not fullness achy and throbbing sensation. Did got to the beach this weekend and water did get in it.

## 2020-09-22 NOTE — ED Provider Notes (Signed)
Rod[facility] Palau    CSN: [id number] Arrival date & time: 09/22/20  1656      History   Chief Complaint Chief Complaint  Patient presents with   Otalgia    HPI Jaclyn Bell is a 25 y.o. female.  Patient presents with right ear pain for 2 to 3 days.  Her symptoms started after she was at the beach and got water in it.  Pain is worse with movement.  She reports small amount of drainage.  She denies fever, chills, sore throat, cough, or other symptoms.  Treatment attempted at home with OTC pain reliever and Mucinex.  The history is provided by the patient and medical records.   Past Medical History:  Diagnosis Date   Genital herpes simplex type 1 infection 03/18/2017    Patient Active Problem List   Diagnosis Date Noted   GAD (generalized anxiety disorder) 08/01/2020   Rash and nonspecific skin eruption 08/01/2020   Frequent headaches 11/09/2019   Preventative health care 11/09/2019   Lower extremity numbness 08/16/2018   Dysmenorrhea 11/11/2017   Fatigue 11/11/2017   Contraception management 11/27/2015   Acne 10/10/2013   Menorrhagia 12/01/2012   ALLERGIC RHINITIS, SEASONAL 05/15/2007    Past Surgical History:  Procedure Laterality Date   FOOT SURGERY     pin in place   TONSILLECTOMY AND ADENOIDECTOMY     TUBES IN EARS Bilateral     OB History     Gravida  0   Para  0   Term  0   Preterm  0   AB  0   Living  0      SAB  0   IAB  0   Ectopic  0   Multiple  0   Live Births  0            Home Medications    Prior to Admission medications   Medication Sig Start Date End Date Taking? Authorizing Provider  neomycin-polymyxin-hydrocortisone (CORTISPORIN) 3.5-10000-1 OTIC suspension Place 4 drops into the right ear 3 (three) times daily. 09/22/20  Yes Sharion Balloon, NP  naproxen (NAPROSYN) 500 MG tablet Take 1 tablet (500 mg total) by mouth 2 (two) times daily with a meal. As needed for pain 12/22/17   Anyanwu, Sallyanne Havers, MD   norgestimate-ethinyl estradiol (ESTARYLLA) 0.25-35 MG-MCG tablet TAKE 1 TABLET BY MOUTH DAILY. ONLY ACTIVE HORMONAL PILS CONTINUOUSLY 06/04/20   Anyanwu, Sallyanne Havers, MD  sertraline (ZOLOFT) 50 MG tablet Take 1 tablet (50 mg total) by mouth daily. For anxiety. Patient taking differently: Take 50 mg by mouth daily. 1/2 tab For anxiety. 08/01/20   Pleas Koch, NP  spironolactone (ALDACTONE) 25 MG tablet Take 25 mg by mouth daily.    [provider]  tretinoin (RETIN-A) 0.025 % cream Apply pea-sized amount to face nightly 01/24/20   [provider]    Family History Family History  Problem Relation Age of Onset   Hypertension Mother    Mental illness Father    Cancer Maternal Grandmother     Social History Social History   Tobacco Use   Smoking status: Never   Smokeless tobacco: Never  Vaping Use   Vaping Use: Never used  Substance Use Topics   Alcohol use: Yes    Comment: occ   Drug use: No     Allergies   Patient has no known allergies.   Review of Systems Review of Systems  Constitutional:  Negative for chills and  fever.  HENT:  Positive for ear discharge and ear pain. Negative for sore throat.   Respiratory:  Negative for cough and shortness of breath.   Cardiovascular:  Negative for chest pain and palpitations.  Skin:  Negative for color change and rash.  All other systems reviewed and are negative.   Physical Exam Triage Vital Signs ED Triage Vitals  Enc Vitals Group     BP      Pulse      Resp      Temp      Temp src      SpO2      Weight      Height      Head Circumference      Peak Flow      Pain Score      Pain Loc      Pain Edu?      Excl. in [place]?    No data found.  Updated Vital Signs BP 122/88 (BP Location: Left Arm)   Pulse 73   Temp 98.5 F (36.9 C) (Oral)   Resp 18   SpO2 98%   Visual Acuity Right Eye Distance:   Left Eye Distance:   Bilateral Distance:    Right Eye Near:   Left Eye Near:    Bilateral  Near:     Physical Exam Vitals and nursing note reviewed.  Constitutional:      General: She is not in acute distress.    Appearance: She is well-developed. She is not ill-appearing.  HENT:     Head: Normocephalic and atraumatic.     Right Ear: Drainage, swelling and tenderness present.     Left Ear: Tympanic membrane and ear canal normal.     Nose: Nose normal.     Mouth/Throat:     Mouth: Mucous membranes are moist.     Pharynx: Oropharynx is clear.  Eyes:     Conjunctiva/sclera: Conjunctivae normal.  Cardiovascular:     Rate and Rhythm: Normal rate and regular rhythm.     Heart sounds: Normal heart sounds.  Pulmonary:     Effort: Pulmonary effort is normal. No respiratory distress.     Breath sounds: Normal breath sounds.  Abdominal:     Palpations: Abdomen is soft.     Tenderness: There is no abdominal tenderness.  Musculoskeletal:     Cervical back: Neck supple.  Skin:    General: Skin is warm and dry.  Neurological:     General: No focal deficit present.     Mental Status: She is alert and oriented to person, place, and time.  Psychiatric:        Mood and Affect: Mood normal.        Behavior: Behavior normal.     UC Treatments / Results  Labs (all labs ordered are listed, but only abnormal results are displayed) Labs Reviewed - No data to display  EKG   Radiology No results found.  Procedures Procedures (including critical care time)  Medications Ordered in UC Medications - No data to display  Initial Impression / Assessment and Plan / UC Course  I have reviewed the triage vital signs and the nursing notes.  Pertinent labs & imaging results that were available during my care of the patient were reviewed by me and considered in my medical decision making (see chart for details).  Right otitis externa.  Treating with Cortisporin eardrops.  Instructed patient to continue Tylenol or ibuprofen as needed for discomfort.  Discussed that she should follow-up  with her PCP if her symptoms are not improving.  Education provided on otitis externa.  Patient agrees to plan of care.   Final Clinical Impressions(s) / UC Diagnoses   Final diagnoses:  Acute otitis externa of right ear, unspecified type     Discharge Instructions      Use the eardrops as directed.  Follow-up with your primary care provider if your symptoms are not improving.         ED Prescriptions     Medication Sig Dispense Auth. Provider   neomycin-polymyxin-hydrocortisone (CORTISPORIN) 3.5-10000-1 OTIC suspension Place 4 drops into the right ear 3 (three) times daily. 10 mL Sharion Balloon, NP      PDMP not reviewed this encounter.   Sharion Balloon, NP 09/22/20 [phone]

## 2020-09-22 NOTE — Discharge Instructions (Addendum)
Use the ear drops as directed.  Follow up with your primary care provider if your symptoms are not improving.    

## 2020-09-23 ENCOUNTER — Ambulatory Visit: Payer: Managed Care, Other (non HMO) | Admitting: Family Medicine

## 2020-10-02 ENCOUNTER — Other Ambulatory Visit: Payer: Self-pay | Admitting: Primary Care

## 2020-10-02 DIAGNOSIS — F411 Generalized anxiety disorder: Secondary | ICD-10-CM

## 2020-10-02 NOTE — Telephone Encounter (Signed)
F/u set for September. One refill sent in.

## 2020-11-14 ENCOUNTER — Ambulatory Visit (INDEPENDENT_AMBULATORY_CARE_PROVIDER_SITE_OTHER): Payer: Managed Care, Other (non HMO) | Admitting: Primary Care

## 2020-11-14 ENCOUNTER — Encounter: Payer: Self-pay | Admitting: Primary Care

## 2020-11-14 ENCOUNTER — Other Ambulatory Visit: Payer: Self-pay

## 2020-11-14 VITALS — BP 110/62 | HR 87 | Temp 97.7°F | Ht 63.0 in | Wt 149.0 lb

## 2020-11-14 DIAGNOSIS — L700 Acne vulgaris: Secondary | ICD-10-CM | POA: Diagnosis not present

## 2020-11-14 DIAGNOSIS — Z23 Encounter for immunization: Secondary | ICD-10-CM

## 2020-11-14 DIAGNOSIS — N921 Excessive and frequent menstruation with irregular cycle: Secondary | ICD-10-CM

## 2020-11-14 DIAGNOSIS — R519 Headache, unspecified: Secondary | ICD-10-CM

## 2020-11-14 DIAGNOSIS — N946 Dysmenorrhea, unspecified: Secondary | ICD-10-CM | POA: Diagnosis not present

## 2020-11-14 DIAGNOSIS — Z0000 Encounter for general adult medical examination without abnormal findings: Secondary | ICD-10-CM | POA: Diagnosis not present

## 2020-11-14 DIAGNOSIS — F411 Generalized anxiety disorder: Secondary | ICD-10-CM

## 2020-11-14 NOTE — Assessment & Plan Note (Signed)
On OCP's for now, is considering stopping. Following with GYN.

## 2020-11-14 NOTE — Patient Instructions (Signed)
Try a probiotic as discussed.  It was a pleasure to see you today!  Preventive Care 68-25 Years Old, Female Preventive care refers to lifestyle choices and visits with your health care provider that can promote health and wellness. This includes: A yearly physical exam. This is also called an annual wellness visit. Regular dental and eye exams. Immunizations. Screening for certain conditions. Healthy lifestyle choices, such as: Eating a healthy diet. Getting regular exercise. Not using drugs or products that contain nicotine and tobacco. Limiting alcohol use. What can I expect for my preventive care visit? Physical exam Your health care provider may check your: Height and weight. These may be used to calculate your BMI (body mass index). BMI is a measurement that tells if you are at a healthy weight. Heart rate and blood pressure. Body temperature. Skin for abnormal spots. Counseling Your health care provider may ask you questions about your: Past medical problems. Family's medical history. Alcohol, tobacco, and drug use. Emotional well-being. Home life and relationship well-being. Sexual activity. Diet, exercise, and sleep habits. Work and work Statistician. Access to firearms. Method of birth control. Menstrual cycle. Pregnancy history. What immunizations do I need? Vaccines are usually given at various ages, according to a schedule. Your health care provider will recommend vaccines for you based on your age, medical history, and lifestyle or other factors, such as travel or where you work. What tests do I need? Blood tests Lipid and cholesterol levels. These may be checked every 5 years starting at age 42. Hepatitis C test. Hepatitis B test. Screening Diabetes screening. This is done by checking your blood sugar (glucose) after you have not eaten for a while (fasting). STD (sexually transmitted disease) testing, if you are at risk. BRCA-related cancer screening. This  may be done if you have a family history of breast, ovarian, tubal, or peritoneal cancers. Pelvic exam and Pap test. This may be done every 3 years starting at age 74. Starting at age 33, this may be done every 5 years if you have a Pap test in combination with an HPV test. Talk with your health care provider about your test results, treatment options, and if necessary, the need for more tests. Follow these instructions at home: Eating and drinking  Eat a healthy diet that includes fresh fruits and vegetables, whole grains, lean protein, and low-fat dairy products. Take vitamin and mineral supplements as recommended by your health care provider. Do not drink alcohol if: Your health care provider tells you not to drink. You are pregnant, may be pregnant, or are planning to become pregnant. If you drink alcohol: Limit how much you have to 0-1 drink a day. Be aware of how much alcohol is in your drink. In the [place] one drink equals one 12 oz bottle of beer (355 mL), one 5 oz glass of wine (148 mL), or one 1 oz glass of hard liquor (44 mL). Lifestyle Take daily care of your teeth and gums. Brush your teeth every morning and night with fluoride toothpaste. Floss one time each day. Stay active. Exercise for at least 30 minutes 5 or more days each week. Do not use any products that contain nicotine or tobacco, such as cigarettes, e-cigarettes, and chewing tobacco. If you need help quitting, ask your health care provider. Do not use drugs. If you are sexually active, practice safe sex. Use a condom or other form of protection to prevent STIs (sexually transmitted infections). If you do not wish to become pregnant, use a  form of birth control. If you plan to become pregnant, see your health care provider for a prepregnancy visit. Find healthy ways to cope with stress, such as: Meditation, yoga, or listening to music. Journaling. Talking to a trusted person. Spending time with friends and  family. Safety Always wear your seat belt while driving or riding in a vehicle. Do not drive: If you have been drinking alcohol. Do not ride with someone who has been drinking. When you are tired or distracted. While texting. Wear a helmet and other protective equipment during sports activities. If you have firearms in your house, make sure you follow all gun safety procedures. Seek help if you have been physically or sexually abused. What's next? Go to your health care provider once a year for an annual wellness visit. Ask your health care provider how often you should have your eyes and teeth checked. Stay up to date on all vaccines. This information is not intended to replace advice given to you by your health care provider. Make sure you discuss any questions you have with your health care provider. Document Revised: 04/11/2020 Document Reviewed: 10/13/2017 Elsevier Patient Education  2022 [employer].

## 2020-11-14 NOTE — Assessment & Plan Note (Signed)
Continues to do well on sertraline 12.5 mg, continue same.

## 2020-11-14 NOTE — Progress Notes (Signed)
Subjective:    Patient ID: Jaclyn Bell, female    DOB: 1995-03-03, 25 y.o.   MRN: [id number]  HPI  Jaclyn Bell is a very pleasant 25 y.o. female who presents today for complete physical and follow up of chronic conditions.  Immunizations: -Tetanus: 2018 -Influenza: Due -Covid-19: 3 vaccines  -HPV: Completed series   Diet: Fair diet.  Exercise: No regular exercise within the last few weeks. Is planning on resuming.   Eye exam: Completes annually  Dental exam: Completes semi-annually   Pap Smear: Completed in 2021  BP Readings from Last 3 Encounters:  11/14/20 110/62  09/22/20 122/88  09/19/20 110/62         Review of Systems  Constitutional:  Negative for unexpected weight change.  HENT:  Negative for rhinorrhea.   Respiratory:  Negative for cough and shortness of breath.   Cardiovascular:  Negative for chest pain.  Gastrointestinal:  Negative for constipation and diarrhea.  Genitourinary:  Negative for difficulty urinating and menstrual problem.  Musculoskeletal:  Negative for arthralgias and myalgias.  Skin:  Negative for rash.  Allergic/Immunologic: Negative for environmental allergies.  Neurological:  Negative for dizziness and headaches.  Psychiatric/Behavioral:  The patient is not nervous/anxious.         Past Medical History:  Diagnosis Date   Genital herpes simplex type 1 infection 03/18/2017    Social History   Socioeconomic History   Marital status: Single    Spouse name: Not on file   Number of children: Not on file   Years of education: Not on file   Highest education level: Not on file  Occupational History   Not on file  Tobacco Use   Smoking status: Never   Smokeless tobacco: Never  Vaping Use   Vaping Use: Never used  Substance and Sexual Activity   Alcohol use: Yes    Comment: occ   Drug use: No   Sexual activity: Yes    Birth control/protection: Pill  Other Topics Concern   Not on file  Social History Narrative    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   Lives with her mother and younger sister.   Enjoys shopping.   Social Determinants of Health   Financial Resource Strain: Not on file  Food Insecurity: Not on file  Transportation Needs: Not on file  Physical Activity: Not on file  Stress: Not on file  Social Connections: Not on file  Intimate Partner Violence: Not on file    Past Surgical History:  Procedure Laterality Date   FOOT SURGERY     pin in place   TONSILLECTOMY AND ADENOIDECTOMY     TUBES IN EARS Bilateral     Family History  Problem Relation Age of Onset   Hypertension Mother    Mental illness Father    Cancer Maternal Grandmother     No Known Allergies  Current Outpatient Medications on File Prior to Visit  Medication Sig Dispense Refill   naproxen (NAPROSYN) 500 MG tablet Take 1 tablet (500 mg total) by mouth 2 (two) times daily with a meal. As needed for pain 60 tablet 2   norgestimate-ethinyl estradiol (ESTARYLLA) 0.25-35 MG-MCG tablet TAKE 1 TABLET BY MOUTH DAILY. ONLY ACTIVE HORMONAL PILS CONTINUOUSLY 84 tablet 3   sertraline (ZOLOFT) 50 MG tablet TAKE 1 TABLET(50 MG) BY MOUTH DAILY FOR ANXIETY 30 tablet 0   spironolactone (ALDACTONE) 25 MG tablet Take 25 mg by mouth daily.     tretinoin (  RETIN-A) 0.025 % cream Apply pea-sized amount to face nightly     neomycin-polymyxin-hydrocortisone (CORTISPORIN) 3.5-10000-1 OTIC suspension Place 4 drops into the right ear 3 (three) times daily. 10 mL 0   No current facility-administered medications on file prior to visit.    BP 110/62   Pulse 87   Temp 97.7 F (36.5 C) (Temporal)   Ht 5\' 3"  (1.6 m)   Wt 149 lb (67.6 kg)   SpO2 98%   BMI 26.39 kg/m  Objective:   Physical Exam HENT:     Right Ear: Tympanic membrane and ear canal normal.     Left Ear: Tympanic membrane and ear canal normal.     Nose: Nose normal.  Eyes:     Conjunctiva/sclera: Conjunctivae normal.     Pupils: Pupils are equal,  round, and reactive to light.  Neck:     Thyroid: No thyromegaly.  Cardiovascular:     Rate and Rhythm: Normal rate and regular rhythm.     Heart sounds: No murmur heard. Pulmonary:     Effort: Pulmonary effort is normal.     Breath sounds: Normal breath sounds. No rales.  Abdominal:     General: Bowel sounds are normal.     Palpations: Abdomen is soft.     Tenderness: There is no abdominal tenderness.  Musculoskeletal:        General: Normal range of motion.     Cervical back: Neck supple.  Lymphadenopathy:     Cervical: No cervical adenopathy.  Skin:    General: Skin is warm and dry.     Findings: No rash.  Neurological:     Mental Status: She is alert and oriented to person, place, and time.     Cranial Nerves: No cranial nerve deficit.     Deep Tendon Reflexes: Reflexes are normal and symmetric.  Psychiatric:        Mood and Affect: Mood normal.          Assessment & Plan:      This visit occurred during the SARS-CoV-2 public health emergency.  Safety protocols were in place, including screening questions prior to the visit, additional usage of staff PPE, and extensive cleaning of exam room while observing appropriate contact time as indicated for disinfecting solutions.

## 2020-11-14 NOTE — Assessment & Plan Note (Signed)
Improved on OCP's and spironolactone 25 mg.  She is considering stopping OCP's. Continue OCP's and spironolactone.

## 2020-11-14 NOTE — Assessment & Plan Note (Signed)
On OCP's, follows with GYN. She is considering coming off OCP's.  Continue for now.

## 2020-11-14 NOTE — Assessment & Plan Note (Signed)
Overall stable in general, mostly notices prior to menses and weather changes.   Continue to monitor.  Continue Excedrin Migraine PRN, uses infrequently.

## 2020-11-14 NOTE — Assessment & Plan Note (Signed)
Immunizations UTD.  Pap smear UTD.  Discussed the importance of a healthy diet and regular exercise in order for weight loss, and to reduce the risk of further co-morbidity.  Exam today unremarkable.

## 2020-11-29 ENCOUNTER — Other Ambulatory Visit: Payer: Self-pay | Admitting: Obstetrics & Gynecology

## 2020-11-29 DIAGNOSIS — L700 Acne vulgaris: Secondary | ICD-10-CM

## 2020-11-29 DIAGNOSIS — N946 Dysmenorrhea, unspecified: Secondary | ICD-10-CM

## 2020-12-01 ENCOUNTER — Other Ambulatory Visit: Payer: Self-pay | Admitting: Obstetrics & Gynecology

## 2020-12-01 DIAGNOSIS — L700 Acne vulgaris: Secondary | ICD-10-CM

## 2020-12-01 DIAGNOSIS — N946 Dysmenorrhea, unspecified: Secondary | ICD-10-CM

## 2020-12-01 MED ORDER — NORGESTIMATE-ETH ESTRADIOL 0.25-35 MG-MCG PO TABS: ORAL_TABLET | ORAL | 4 refills | Status: DC

## 2021-03-03 ENCOUNTER — Telehealth: Payer: Self-pay

## 2021-03-03 NOTE — Telephone Encounter (Signed)
I spoke with pts mom. Pts symptoms started on 02/19/21 with cough and cold symptoms; sometimes after coughing episode it takes pt breath away for brief period of time and pt has burning sensation in mid chest after coughing episode. Pt has dry cough and at times prod cough with green yellow phlegm.No fever, no wheezing, pt has been taking mucinex, robitussin and tessalon. 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 UC & ED precautions given and pt's mom voiced understanding.That is why appt was made for 03/05/21. Pts mom said if pt condition changes or worsens will take pt to ED. Wanted to keep appt already scheduled by pt. Sending note to Gentry Fitz NP and [facility] CMA.

## 2021-03-03 NOTE — Telephone Encounter (Signed)
Noted, will evaluate as scheduled.  

## 2021-03-05 ENCOUNTER — Other Ambulatory Visit: Payer: Self-pay

## 2021-03-05 ENCOUNTER — Ambulatory Visit: Payer: Managed Care, Other (non HMO) | Admitting: Primary Care

## 2021-03-05 ENCOUNTER — Encounter: Payer: Self-pay | Admitting: Primary Care

## 2021-03-05 DIAGNOSIS — J4520 Mild intermittent asthma, uncomplicated: Secondary | ICD-10-CM

## 2021-03-05 DIAGNOSIS — R053 Chronic cough: Secondary | ICD-10-CM | POA: Diagnosis not present

## 2021-03-05 DIAGNOSIS — J45909 Unspecified asthma, uncomplicated: Secondary | ICD-10-CM

## 2021-03-05 HISTORY — DX: Chronic cough: R05.3

## 2021-03-05 HISTORY — DX: Unspecified asthma, uncomplicated: J45.909

## 2021-03-05 MED ORDER — ALBUTEROL SULFATE HFA 108 (90 BASE) MCG/ACT IN AERS
2.0000 | INHALATION_SPRAY | RESPIRATORY_TRACT | 0 refills | Status: DC | PRN
Start: 2021-03-05 — End: 2023-01-09

## 2021-03-05 MED ORDER — PREDNISONE 20 MG PO TABS: ORAL_TABLET | ORAL | 0 refills | Status: DC

## 2021-03-05 NOTE — Assessment & Plan Note (Signed)
Combination of post viral and asthma flare. Lungs clear. Doesn't appear sickly.  Will treat with prednisone 40 mg daily x 5 days. Rx for albuterol inhaler sent to pharmacy.  Start omeprazole 20 mg for reflux aggravating cough.   Continue Robitussin or Tessalon Perles.  She will update in one week.

## 2021-03-05 NOTE — Assessment & Plan Note (Signed)
Overall no issues.  Do suspect her recent viral infection has caused a flare.  Will treat with course of prednisone 40 mg daily x 5 days. Rx for albuterol inhaler sent to pharmacy to use PRN.  She will update.

## 2021-03-05 NOTE — Patient Instructions (Signed)
Shortness of Breath/Wheezing/Cough: Use the albuterol inhaler. Inhale 2 puffs into the lungs every 4 to 6 hours as needed for wheezing, cough, and/or shortness of breath.   Start prednisone 20 mg tablets. Take 2 tablets by mouth once daily for 5 days.  Start heartburn medication such as omeprazole 20 mg at bedtime for cough and heartburn.   Please update me in 1 week!  It was a pleasure to see you today!

## 2021-03-05 NOTE — Progress Notes (Signed)
Subjective:    Patient ID: Jaclyn Bell, female    DOB: 12/14/1995, 26 y.o.   MRN: [id number]  HPI  Jaclyn Bell is a very pleasant 26 y.o. female with a history of allergic rhinitis, frequent headaches, GAD who presents today to discuss cough.  Tested positive for Covid-19 on 02/19/21, symptoms were nasal congestion, sinus pressure, headache. Symptoms resolved 3-4 days ago. She then developed a cough one week ago. Deep, dry and raspy cough with coughing fits, can't catch her breath. Has to "pant to breathe".   Mostly cough is non productive. Sore throat from the cough, chest tightness and soreness after coughing spells. She has noticed esophageal reflux before and after coughing fits.   She was evaluated by [facility]" through her occupation on 02/20/21, was told her symptoms were secondary to allergies and was prescribed Singulair 10 mg and Tessalon Perles 200 mg.   She was evaluated five days ago through "[facility]" as symptoms were not improving, was told to see PCP and to take Robitussin and Tessalon Perles.   She denies fevers, chills, fatigue. Overall feels well except for her coughing spells. History of asthma as a child, no issues as an adult. She does not have an albuterol inhaler.     Review of Systems  Constitutional:  Negative for chills, fatigue and fever.  HENT:  Positive for sore throat. Negative for congestion.   Respiratory:  Positive for cough, chest tightness and shortness of breath.   Gastrointestinal:        GERD        Past Medical History:  Diagnosis Date   Genital herpes simplex type 1 infection 03/18/2017    Social History   Socioeconomic History   Marital status: Single    Spouse name: Not on file   Number of children: Not on file   Years of education: Not on file   Highest education level: Not on file  Occupational History   Not on file  Tobacco Use   Smoking status: Never   Smokeless tobacco: Never  Vaping Use   Vaping Use: Never used   Substance and Sexual Activity   Alcohol use: Yes    Comment: occ   Drug use: No   Sexual activity: Yes    Birth control/protection: Pill  Other Topics Concern   Not on file  Social History Narrative   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   Lives with her mother and younger sister.   Enjoys shopping.   Social Determinants of Health   Financial Resource Strain: Not on file  Food Insecurity: Not on file  Transportation Needs: Not on file  Physical Activity: Not on file  Stress: Not on file  Social Connections: Not on file  Intimate Partner Violence: Not on file    Past Surgical History:  Procedure Laterality Date   FOOT SURGERY     pin in place   TONSILLECTOMY AND ADENOIDECTOMY     TUBES IN EARS Bilateral     Family History  Problem Relation Age of Onset   Hypertension Mother    Mental illness Father    Cancer Maternal Grandmother     No Known Allergies  Current Outpatient Medications on File Prior to Visit  Medication Sig Dispense Refill   benzonatate (TESSALON) 200 MG capsule SMARTSIG:1 Capsule(s) By Mouth 2-3 Times Daily PRN     montelukast (SINGULAIR) 10 MG tablet Take 10 mg by mouth daily.  naproxen (NAPROSYN) 500 MG tablet Take 1 tablet (500 mg total) by mouth 2 (two) times daily with a meal. As needed for pain 60 tablet 2   norgestimate-ethinyl estradiol (ESTARYLLA) 0.25-35 MG-MCG tablet TAKE 1 TABLET BY MOUTH EVERY DAY, TAKE ONLY THE ACTIVE HORMONAL PILLS CONTINUOUSLY AS DIRECTED 84 tablet 4   sertraline (ZOLOFT) 50 MG tablet TAKE 1 TABLET(50 MG) BY MOUTH DAILY FOR ANXIETY 30 tablet 0   spironolactone (ALDACTONE) 25 MG tablet Take 25 mg by mouth daily.     tretinoin (RETIN-A) 0.025 % cream Apply pea-sized amount to face nightly     No current facility-administered medications on file prior to visit.    BP 116/72    Pulse 77    Temp (!) 97.4 F (36.3 C) (Temporal)    Ht 5\' 3"  (1.6 m)    Wt 145 lb 4 oz (65.9 kg)    LMP 02/14/2021     SpO2 95%    BMI 25.73 kg/m  Objective:   Physical Exam Constitutional:      Appearance: She is not ill-appearing.  Cardiovascular:     Rate and Rhythm: Normal rate and regular rhythm.  Pulmonary:     Effort: Pulmonary effort is normal.     Breath sounds: Normal breath sounds. No wheezing or rales.     Comments: Bronchospasm type cough noted once during visit. Musculoskeletal:     Cervical back: Neck supple.  Skin:    General: Skin is warm and dry.  Neurological:     Mental Status: She is alert.          Assessment & Plan:      This visit occurred during the SARS-CoV-2 public health emergency.  Safety protocols were in place, including screening questions prior to the visit, additional usage of staff PPE, and extensive cleaning of exam room while observing appropriate contact time as indicated for disinfecting solutions.

## 2021-03-18 ENCOUNTER — Other Ambulatory Visit: Payer: Self-pay | Admitting: Primary Care

## 2021-03-18 DIAGNOSIS — J4520 Mild intermittent asthma, uncomplicated: Secondary | ICD-10-CM

## 2021-04-06 DIAGNOSIS — R053 Chronic cough: Secondary | ICD-10-CM

## 2021-04-06 DIAGNOSIS — K219 Gastro-esophageal reflux disease without esophagitis: Secondary | ICD-10-CM

## 2021-04-07 ENCOUNTER — Other Ambulatory Visit: Payer: Self-pay | Admitting: Primary Care

## 2021-04-07 DIAGNOSIS — K219 Gastro-esophageal reflux disease without esophagitis: Secondary | ICD-10-CM

## 2021-04-07 DIAGNOSIS — R053 Chronic cough: Secondary | ICD-10-CM

## 2021-04-07 MED ORDER — PANTOPRAZOLE SODIUM 20 MG PO TBEC: 20.0000 mg | DELAYED_RELEASE_TABLET | Freq: Every day | ORAL | 0 refills | Status: DC

## 2021-04-17 ENCOUNTER — Other Ambulatory Visit: Payer: Self-pay

## 2021-04-17 ENCOUNTER — Ambulatory Visit: Payer: Managed Care, Other (non HMO) | Admitting: Podiatry

## 2021-04-17 DIAGNOSIS — L989 Disorder of the skin and subcutaneous tissue, unspecified: Secondary | ICD-10-CM | POA: Diagnosis not present

## 2021-04-17 NOTE — Progress Notes (Signed)
? ?  Subjective: ?26 y.o. female presenting to the office today for evaluation of a symptomatic callus to the plantar aspect of the fifth MTP bilateral.  She is also concerned about a loosely adhered toenail plate to the left third toe.  She says that she remembers stubbing the toe and she is concerned that it is falling off.  She presents for further treatment and evaluation ? ? ?Past Medical History:  ?Diagnosis Date  ? Genital herpes simplex type 1 infection 03/18/2017  ? ? ? ?Objective:  ?Physical Exam ?General: Alert and oriented x3 in no acute distress ? ?Dermatology: Hyperkeratotic lesion(s) present on the plantar aspect of the fifth MTP bilateral. Pain on palpation with a central nucleated core noted. Skin is warm, dry and supple bilateral lower extremities. Negative for open lesions or macerations.  Loosely adhered nail plate noted to the left third digit ? ?Vascular: Palpable pedal pulses bilaterally. No edema or erythema noted. Capillary refill within normal limits. ? ?Neurological: Epicritic and protective threshold grossly intact bilaterally.  ? ?Musculoskeletal Exam: Tenderness on palpation at the keratotic lesion(s) noted. Range of motion within normal limits bilateral. Muscle strength 5/5 in all groups bilateral. ? ?Assessment: ?1.  Loosely adhered toenail left third toe ?2.  Symptomatic callus lesions plantar aspect of the fifth MTP bilateral ? ? ?Plan of Care:  ?1. Patient evaluated ?2. Excisional debridement of keratoic lesion(s) using a chisel blade was performed without incident.  ?3.  Mechanical debridement of the loosely adhered toenail plate to the left third toe was performed using a nail nipper without incident or bleeding.  The patient tolerated this well.  Underlying the loosely adhered nail plate was a healthy viable nail that was growing in  ?4.  Recommend biotin supplement as well as urea topical nail gel to apply to the nails to help strengthen the nails and soften them. ?5.  Return to  clinic as needed ? ?*Dental assistant in [place] ? ?Edrick Kins, DPM ?[place] ? ?Dr. Edrick Kins, DPM  ?  ?[place]                                        ?[place], [place] [address]                ?Office [phone]  ?Fax [phone] ? ? ? ? ?

## 2021-05-09 ENCOUNTER — Other Ambulatory Visit: Payer: Self-pay | Admitting: Primary Care

## 2021-05-09 DIAGNOSIS — R053 Chronic cough: Secondary | ICD-10-CM

## 2021-05-09 DIAGNOSIS — K219 Gastro-esophageal reflux disease without esophagitis: Secondary | ICD-10-CM

## 2021-06-17 DIAGNOSIS — R21 Rash and other nonspecific skin eruption: Secondary | ICD-10-CM

## 2021-06-19 ENCOUNTER — Ambulatory Visit: Payer: Managed Care, Other (non HMO) | Admitting: Primary Care

## 2021-06-19 ENCOUNTER — Encounter: Payer: Self-pay | Admitting: Primary Care

## 2021-06-19 DIAGNOSIS — R21 Rash and other nonspecific skin eruption: Secondary | ICD-10-CM

## 2021-06-19 MED ORDER — METHYLPREDNISOLONE ACETATE 80 MG/ML IJ SUSP
80.0000 mg | Freq: Once | INTRAMUSCULAR | Status: AC
  Administered 2021-06-19: 80 mg via INTRAMUSCULAR

## 2021-06-19 MED ORDER — PREDNISONE 20 MG PO TABS: ORAL_TABLET | ORAL | 0 refills | Status: DC

## 2021-06-19 NOTE — Assessment & Plan Note (Signed)
Appears fungal today. ?See pictures under physical exam. ? ?Continue clotrimazole 1% cream twice daily. ?IM Depo-Medrol 80 mg provided today. ? ?Start prednisone 40 mg daily x5 days, starting tomorrow. ?Discussed instructions with patient. ? ?She will update mid next week.  Fortunately she has switched jobs and also gloves.  ? ?

## 2021-06-19 NOTE — Progress Notes (Signed)
? ?Subjective:  ? ? Patient ID: Jaclyn Bell, female    DOB: 31-Mar-1995, 26 y.o.   MRN: [id number] ? ?HPI ? ?Jaclyn Bell is a very pleasant 26 y.o. female with a history of acne, rash, asthma who presents today to discuss rash. ? ?Her rash is located to the bilateral dorsal hands which began in late March 2023. Her rash is itchy with red splotches, burns when she uses hand sanitizer. She believes her rash could be secondary to gloves that she uses at work. 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 She will be starting next week.  ? ?She was treated by  [facility] on 05/19/21, diagnosed for a "bacterial infection", was prescribed mupirocin 2% cream, triamcinolone 0.1% ointment, and clotrimazole 1% cream. When using the mupirocin and triamcinolone her rash improved slightly but without resolve.  ? ?She began using clotrimazole 1% two days ago, has applied 2 applications without improvement. She took a few days of Allegra, no improvement. She has Xyzal at home.  ? ?Follows with dermatology for prior rash, underwent CBC, TSH, ANA, vitamin D, all of which were negative. Labs were completed on 05/22/21. She cannot get into her dermatologist's office. ? ?No new lotions, detergents, soaps or shampoos. ?No new medicines, vitamins, supplements. ?No recent outdoor exposure or poison ivy exposure. No bonfire or smoke exposure.  ?No recent motel or hotel stay or new beds. ?  ?No fevers/chills, oral lesions, new joint pains, tick bites, abdominal pain, nausea.  ? ?She did get a new puppy in later February 2023.  ? ? ?BP Readings from Last 3 Encounters:  ?06/19/21 116/74  ?03/05/21 116/72  ?11/14/20 110/62  ? ? ? ?Review of Systems  ?Constitutional:  Negative for fatigue.  ?Respiratory:  Negative for shortness of breath and wheezing.   ?Skin:  Positive for color change and rash.  ? ?   ? ? ?Past Medical History:  ?Diagnosis Date  ? Genital herpes simplex type 1 infection  03/18/2017  ? ? ?Social History  ? ?Socioeconomic History  ? Marital status: Single  ?  Spouse name: Not on file  ? Number of children: Not on file  ? Years of education: Not on file  ? Highest education level: Not on file  ?Occupational History  ? Not on file  ?Tobacco Use  ? Smoking status: Never  ? Smokeless tobacco: Never  ?Vaping Use  ? Vaping Use: Never used  ?Substance and Sexual Activity  ? Alcohol use: Yes  ?  Comment: occ  ? Drug use: No  ? Sexual activity: Yes  ?  Birth control/protection: Pill  ?Other Topics Concern  ? Not on file  ?Social History Narrative  ? Works as Art therapist at [employer] in [place].  ? Lives with her mother and younger sister.  ? Enjoys shopping.  ? ?Social Determinants of Health  ? ?Financial Resource Strain: Not on file  ?Food Insecurity: Not on file  ?Transportation Needs: Not on file  ?Physical Activity: Not on file  ?Stress: Not on file  ?Social Connections: Not on file  ?Intimate Partner Violence: Not on file  ? ? ?Past Surgical History:  ?Procedure Laterality Date  ? FOOT SURGERY    ? pin in place  ? TONSILLECTOMY AND ADENOIDECTOMY    ? TUBES IN EARS Bilateral   ? ? ?Family History  ?Problem Relation Age of Onset  ? Hypertension Mother   ?  Mental illness Father   ? Cancer Maternal Grandmother   ? ? ?No Known Allergies ? ?Current Outpatient Medications on File Prior to Visit  ?Medication Sig Dispense Refill  ? albuterol (VENTOLIN HFA) 108 (90 Base) MCG/ACT inhaler Inhale 2 puffs into the lungs every 4 (four) hours as needed for shortness of breath. 1 each 0  ? fexofenadine (ALLEGRA) 180 MG tablet Take 180 mg by mouth daily.    ? naproxen (NAPROSYN) 500 MG tablet Take 1 tablet (500 mg total) by mouth 2 (two) times daily with a meal. As needed for pain 60 tablet 2  ? norgestimate-ethinyl estradiol (ESTARYLLA) 0.25-35 MG-MCG tablet TAKE 1 TABLET BY MOUTH EVERY DAY, TAKE ONLY THE ACTIVE HORMONAL PILLS CONTINUOUSLY AS DIRECTED 84 tablet 4  ? pantoprazole (PROTONIX) 20  MG tablet TAKE 1 TABLET(20 MG) BY MOUTH DAILY FOR HEARTBURN 90 tablet 0  ? sertraline (ZOLOFT) 50 MG tablet TAKE 1 TABLET(50 MG) BY MOUTH DAILY FOR ANXIETY 30 tablet 0  ? tretinoin (RETIN-A) 0.025 % cream Apply pea-sized amount to face nightly    ? ?No current facility-administered medications on file prior to visit.  ? ? ?BP (!) 116/24   Pulse (!) 54   Temp 98.6 ?F (37 ?C) (Oral)   Ht '5\' 3"'$  (1.6 m)   Wt 149 lb (67.6 kg)   SpO2 95%   BMI 26.39 kg/m?  ?Objective:  ? Physical Exam ?Constitutional:   ?   General: She is not in acute distress. ?Pulmonary:  ?   Effort: Pulmonary effort is normal.  ?   Breath sounds: Normal breath sounds. No wheezing.  ?Skin: ?   General: Skin is warm and dry.  ?   Findings: Rash present.  ?   Comments: Several circular, slightly raised patches of mild erythema. Bumpy. Only on dorsal hands.   ? ? ? ? ? ? ? ? ?   ?Assessment & Plan:  ? ? ? ? ?This visit occurred during the SARS-CoV-2 public health emergency.  Safety protocols were in place, including screening questions prior to the visit, additional usage of staff PPE, and extensive cleaning of exam room while observing appropriate contact time as indicated for disinfecting solutions.  ?

## 2021-06-19 NOTE — Patient Instructions (Signed)
Continue clotrimazole 1% cream twice daily for the next 1 to 2 weeks. ? ?Start prednisone 20 mg tablets. Take 2 tablets by mouth once daily for 5 days. Start this on Saturday. ? ?Please update me next week. ? ?It was a pleasure to see you today! ? ? ?

## 2021-06-19 NOTE — Addendum Note (Signed)
Addended by: Tyrone Apple on: 06/19/2021 08:15 AM ? ? Modules accepted: Orders ? ?

## 2021-06-25 NOTE — Telephone Encounter (Signed)
Patient wants to know if she needs to finish up topical cream, the steroid is done. ? ?Please advise. ?

## 2021-06-26 MED ORDER — KETOCONAZOLE 2 % EX CREA: 1.0000 "application " | TOPICAL_CREAM | Freq: Every day | CUTANEOUS | 0 refills | Status: DC

## 2021-08-05 ENCOUNTER — Other Ambulatory Visit: Payer: Self-pay | Admitting: Primary Care

## 2021-08-05 DIAGNOSIS — K219 Gastro-esophageal reflux disease without esophagitis: Secondary | ICD-10-CM

## 2021-08-05 DIAGNOSIS — R053 Chronic cough: Secondary | ICD-10-CM

## 2021-09-28 ENCOUNTER — Telehealth: Payer: Self-pay | Admitting: Primary Care

## 2021-09-28 DIAGNOSIS — Z111 Encounter for screening for respiratory tuberculosis: Secondary | ICD-10-CM

## 2021-09-28 NOTE — Telephone Encounter (Signed)
Pt dropped off health form for work, to be filled out by provider. Placed in provider's folder, up front

## 2021-10-01 NOTE — Telephone Encounter (Signed)
Please call patient:  Go over TB risk questionnaire and TB symptom questionnaire.  Also screen for Covid.   Form placed in Joellen's inbox.

## 2021-10-02 NOTE — Telephone Encounter (Signed)
Left message to return call to our office.  

## 2021-10-07 NOTE — Telephone Encounter (Signed)
Left message to return call to our office.  

## 2021-10-08 NOTE — Addendum Note (Signed)
Addended by: Pleas Koch on: 10/08/2021 05:28 PM   Modules accepted: Orders

## 2021-10-08 NOTE — Telephone Encounter (Signed)
Spoke to patient from placed in your box for review.

## 2021-10-08 NOTE — Telephone Encounter (Signed)
Noted.  Patient will need TB testing.  Lab order for blood collection placed.

## 2021-10-08 NOTE — Telephone Encounter (Signed)
Patient returned call

## 2021-10-09 NOTE — Telephone Encounter (Signed)
Per patient request I have sent her a my chart with information. Holding for message for when is good time to set her up for lab appointment.

## 2021-10-15 ENCOUNTER — Other Ambulatory Visit (INDEPENDENT_AMBULATORY_CARE_PROVIDER_SITE_OTHER): Payer: Managed Care, Other (non HMO)

## 2021-10-15 DIAGNOSIS — Z111 Encounter for screening for respiratory tuberculosis: Secondary | ICD-10-CM

## 2021-10-19 LAB — QUANTIFERON-TB GOLD PLUS
Mitogen-NIL: >10 IU/mL
NIL: 0.1 IU/mL
QuantiFERON-TB Gold Plus: NEGATIVE
TB1-NIL: <0 IU/mL
TB2-NIL: <0 IU/mL

## 2021-10-27 ENCOUNTER — Encounter: Payer: Self-pay | Admitting: Obstetrics & Gynecology

## 2021-10-27 ENCOUNTER — Ambulatory Visit (INDEPENDENT_AMBULATORY_CARE_PROVIDER_SITE_OTHER): Payer: Managed Care, Other (non HMO) | Admitting: Obstetrics & Gynecology

## 2021-10-27 VITALS — BP 123/82 | HR 89 | Wt 157.0 lb

## 2021-10-27 DIAGNOSIS — Z3041 Encounter for surveillance of contraceptive pills: Secondary | ICD-10-CM | POA: Diagnosis not present

## 2021-10-27 DIAGNOSIS — Z01419 Encounter for gynecological examination (general) (routine) without abnormal findings: Secondary | ICD-10-CM | POA: Diagnosis not present

## 2021-10-27 DIAGNOSIS — N930 Postcoital and contact bleeding: Secondary | ICD-10-CM | POA: Diagnosis not present

## 2021-10-27 DIAGNOSIS — N86 Erosion and ectropion of cervix uteri: Secondary | ICD-10-CM

## 2021-10-27 MED ORDER — NORGESTIMATE-ETH ESTRADIOL 0.25-35 MG-MCG PO TABS: ORAL_TABLET | ORAL | 4 refills | Status: DC

## 2021-10-27 NOTE — Progress Notes (Signed)
GYNECOLOGY ANNUAL PREVENTATIVE CARE ENCOUNTER NOTE  History:     Jaclyn Bell is a 26 y.o. G0P0000 female here for a routine annual gynecologic exam.  Current complaints: has noticed intermittent spotting after intercourse over the last few months, not associated with pain.   Denies abnormal vaginal discharge, pelvic pain, problems with intercourse or other gynecologic concerns.  Needs refill her OCPs, this helps with her acne and dysmenorrhea and she takes this in a continuous fashion.    Gynecologic History Patient's last menstrual period was 09/29/2021. Contraception: OCP (estrogen/progesterone) Last Pap: 07/10/2019. Result was normal   Obstetric History OB History  Gravida Para Term Preterm AB Living  0 0 0 0 0 0  SAB IAB Ectopic Multiple Live Births  0 0 0 0 0    Past Medical History:  Diagnosis Date   Genital herpes simplex type 1 infection 03/18/2017   Hives     Past Surgical History:  Procedure Laterality Date   FOOT SURGERY     pin in place   TONSILLECTOMY AND ADENOIDECTOMY     TUBES IN EARS Bilateral     Current Outpatient Medications on File Prior to Visit  Medication Sig Dispense Refill   albuterol (VENTOLIN HFA) 108 (90 Base) MCG/ACT inhaler Inhale 2 puffs into the lungs every 4 (four) hours as needed for shortness of breath. 1 each 0   ALLERGY RELIEF 180 MG tablet Take 180 mg by mouth daily.     clotrimazole (LOTRIMIN) 1 % cream Apply topically 2 (two) times daily.     levocetirizine (XYZAL) 5 MG tablet Take 5 mg by mouth 2 (two) times daily.     sertraline (ZOLOFT) 50 MG tablet TAKE 1 TABLET(50 MG) BY MOUTH DAILY FOR ANXIETY (Patient not taking: Reported on 10/27/2021) 30 tablet 0   No current facility-administered medications on file prior to visit.    No Known Allergies  Social History:  reports that she has never smoked. She has never used smokeless tobacco. She reports current alcohol use. She reports that she does not use drugs.  Family History   Problem Relation Age of Onset   Hypertension Mother    Mental illness Father    Cancer Maternal Grandmother     The following portions of the patient's history were reviewed and updated as appropriate: allergies, current medications, past family history, past medical history, past social history, past surgical history and problem list.  Review of Systems Pertinent items noted in HPI and remainder of comprehensive ROS otherwise negative.  Physical Exam:  BP 123/82   Pulse 89   Wt 157 lb (71.2 kg)   LMP 09/29/2021   BMI 27.81 kg/m  CONSTITUTIONAL: Well-developed, well-nourished female in no acute distress.  HENT:  Normocephalic, atraumatic, External right and left ear normal.  EYES: Conjunctivae and EOM are normal. Pupils are equal, round, and reactive to light. No scleral icterus.  NECK: Normal range of motion, supple, no masses.  Normal thyroid.  SKIN: Skin is warm and dry. No rash noted. Not diaphoretic. No erythema. No pallor. MUSCULOSKELETAL: Normal range of motion. No tenderness.  No cyanosis, clubbing, or edema. NEUROLOGIC: Alert and oriented to person, place, and time. Normal reflexes, muscle tone coordination.  PSYCHIATRIC: Normal mood and affect. Normal behavior. Normal judgment and thought content. CARDIOVASCULAR: Normal heart rate noted, regular rhythm RESPIRATORY: Clear to auscultation bilaterally. Effort and breath sounds normal, no problems with respiration noted. BREASTS: Symmetric in size. No masses, tenderness, skin changes, nipple drainage, or  lymphadenopathy bilaterally. Performed in the presence of a chaperone. ABDOMEN: Soft, no distention noted.  No tenderness, rebound or guarding.  PELVIC: Normal appearing external genitalia and urethral meatus; normal appearing vaginal mucosa and cervix with prominent ectropion noted.  No abnormal vaginal discharge noted, testing sample obtained.  Pap smear obtained, caused bleeding from ectropion.  Normal uterine size, no other  palpable masses, no uterine or adnexal tenderness.  Performed in the presence of a chaperone.   Assessment and Plan:     1. Bleeding after intercourse 2. 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 Will rule out other possible etiologies, pap and discharge testing done.  If bleeding worsens or continues, may consider cryotherapy of the ectropion. - NuSwab VG+, Candida 6sp - IGP, Aptima HPV, rfx 16/18,45  3. Oral contraceptive pill surveillance Pills refilled - norgestimate-ethinyl estradiol (ESTARYLLA) 0.25-35 MG-MCG tablet; TAKE 1 TABLET BY MOUTH EVERY DAY, TAKE ONLY THE ACTIVE HORMONAL PILLS CONTINUOUSLY AS DIRECTED  Dispense: 84 tablet; Refill: 4  4. Encounter for annual routine gynecological examination Pap done, will follow up results and manage accordingly. - IGP, Aptima HPV, rfx 16/18,45 Routine preventative health maintenance measures emphasized. Please refer to After Visit Summary for other counseling recommendations.      Verita Schneiders, MD, [place] for [employer], [place]

## 2021-10-31 LAB — NUSWAB VG+, CANDIDA 6SP
C PARAPSILOSIS/TROPICALIS: NEGATIVE
Candida albicans, NAA: NEGATIVE
Candida glabrata, NAA: NEGATIVE
Candida krusei, NAA: NEGATIVE
Candida lusitaniae, NAA: NEGATIVE
Chlamydia trachomatis, NAA: NEGATIVE
Neisseria gonorrhoeae, NAA: NEGATIVE
Trich vag by NAA: NEGATIVE

## 2021-10-31 LAB — IGP, APTIMA HPV, RFX 16/18,45
HPV Aptima: NEGATIVE
PAP Smear Comment: 0

## 2021-11-19 ENCOUNTER — Encounter: Payer: Self-pay | Admitting: Primary Care

## 2021-11-19 ENCOUNTER — Ambulatory Visit (INDEPENDENT_AMBULATORY_CARE_PROVIDER_SITE_OTHER): Payer: Managed Care, Other (non HMO) | Admitting: Primary Care

## 2021-11-19 VITALS — BP 110/80 | HR 85 | Temp 98.1°F | Ht 62.25 in | Wt 157.0 lb

## 2021-11-19 DIAGNOSIS — R7989 Other specified abnormal findings of blood chemistry: Secondary | ICD-10-CM

## 2021-11-19 DIAGNOSIS — J4520 Mild intermittent asthma, uncomplicated: Secondary | ICD-10-CM | POA: Diagnosis not present

## 2021-11-19 DIAGNOSIS — R519 Headache, unspecified: Secondary | ICD-10-CM | POA: Diagnosis not present

## 2021-11-19 DIAGNOSIS — K219 Gastro-esophageal reflux disease without esophagitis: Secondary | ICD-10-CM | POA: Insufficient documentation

## 2021-11-19 DIAGNOSIS — Z0001 Encounter for general adult medical examination with abnormal findings: Secondary | ICD-10-CM | POA: Diagnosis not present

## 2021-11-19 DIAGNOSIS — F411 Generalized anxiety disorder: Secondary | ICD-10-CM

## 2021-11-19 DIAGNOSIS — Z23 Encounter for immunization: Secondary | ICD-10-CM

## 2021-11-19 MED ORDER — ESCITALOPRAM OXALATE 10 MG PO TABS: 10.0000 mg | ORAL_TABLET | Freq: Every day | ORAL | 0 refills | Status: DC

## 2021-11-19 NOTE — Assessment & Plan Note (Addendum)
Uncontrolled. GAD 7 score is 11 today.  Sertraline caused side effects.  Will start Escitalopram 10 mg daily, 1/2 tablet daily x 1 week, then 1 full tablet thereafter.  She will update me 4-6 weeks, or sooner if needed.  I evaluated patient, was consulted regarding treatment, and agree with assessment and plan per Tinnie Gens, RN, DNP student.   Allie Bossier, NP-C

## 2021-11-19 NOTE — Assessment & Plan Note (Addendum)
Controlled.   Continue Excedrin Migraine PRN, uses infrequently.   I evaluated patient, was consulted regarding treatment, and agree with assessment and plan per Tinnie Gens, RN, DNP student.   Allie Bossier, NP-C

## 2021-11-19 NOTE — Patient Instructions (Addendum)
Stop by the lab prior to leaving today. I will notify you of your results once received.   Try famotidine 20 mg as needed for acid reflux.  Update me on my chart 4-6 weeks after starting your new anxiety medication.  It was a pleasure to see you today!

## 2021-11-19 NOTE — Progress Notes (Signed)
Established Patient Office Visit  Subjective   Patient ID: Jaclyn Bell, female    DOB: 04-09-1995  Age: 26 y.o. MRN: [id number]  Chief Complaint  Patient presents with   Annual Exam    Fasting Anxiety medication     HPI  Jaclyn Bell is a 26 year old female with past medical history of asthma, GAD, frequent headaches presents today for complete physical and follow up of chronic conditions.  Immunizations: -Tetanus: Completed in 2018. -Influenza: Due this season  -HPV: completed  Diet: Fair diet.  Exercise: No regular exercise. 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   Eye exam: Completes annually  Dental exam: Completes semi-annually   Pap Smear: Completed in Sep, 2023  Anxiety: History of anxiety. Was taking sertraline but stopped it six months due to the side effects. Reports bad dreams, exhaustion and "feels like she doesn't care about anything". She was also having trouble with taking the medication at night and remembering to take it every night and she was missing doses. Gets annoyed or irritated easily. Feels drained all the time and cries easily. She is interested in trying something different.   Acid reflux: Still having mild symptoms three times a week. No relief with Tums.   Headaches: Has them 2- 3 times a week. Denies blurred vision or double vision. Takes OTC Excedrin as needed for relief.   Patient Active Problem List   Diagnosis Date Noted   Abnormal TSH 11/19/2021   Gastroesophageal reflux disease 11/19/2021   Asthma 03/05/2021   Persistent cough 03/05/2021   GAD (generalized anxiety disorder) 08/01/2020   Rash and nonspecific skin eruption 08/01/2020   Frequent headaches 11/09/2019   Encounter for annual general medical examination with abnormal findings in adult 11/09/2019   Lower extremity numbness 08/16/2018   Dysmenorrhea 11/11/2017   Fatigue 11/11/2017   Acne 10/10/2013   Menorrhagia 12/01/2012   ALLERGIC  RHINITIS, SEASONAL 05/15/2007   Past Medical History:  Diagnosis Date   Genital herpes simplex type 1 infection 03/18/2017   Hives    Past Surgical History:  Procedure Laterality Date   FOOT SURGERY     pin in place   TONSILLECTOMY AND ADENOIDECTOMY     TUBES IN EARS Bilateral    Social History   Tobacco Use   Smoking status: Never   Smokeless tobacco: Never  Vaping Use   Vaping Use: Never used  Substance Use Topics   Alcohol use: Yes    Comment: occ   Drug use: No   Family History  Problem Relation Age of Onset   Hypertension Mother    Mental illness Father    Cancer Maternal Grandmother    No Known Allergies    Review of Systems  Constitutional:  Negative for chills and fever.  HENT:  Negative for ear pain, hearing loss and sore throat.   Eyes:  Negative for blurred vision and double vision.  Respiratory:  Negative for shortness of breath and wheezing.   Cardiovascular:  Negative for chest pain.  Gastrointestinal:  Negative for heartburn.  Genitourinary:  Negative for dysuria.  Musculoskeletal:  Negative for joint pain and neck pain.  Skin:  Negative for itching and rash.  Neurological:  Positive for headaches.  Psychiatric/Behavioral:  Negative for depression. The patient is not nervous/anxious.       Objective:     BP 110/80   Pulse 85   Temp 98.1 F (36.7 C) (Temporal)   Ht 5' 2.25" (  1.581 m)   Wt 157 lb (71.2 kg)   LMP 09/29/2021 (Exact Date)   SpO2 98%   BMI 28.49 kg/m  BP Readings from Last 3 Encounters:  11/19/21 110/80  10/27/21 123/82  06/19/21 116/74   Wt Readings from Last 3 Encounters:  11/19/21 157 lb (71.2 kg)  10/27/21 157 lb (71.2 kg)  06/19/21 149 lb (67.6 kg)      Physical Exam Vitals reviewed.  Constitutional:      Appearance: Normal appearance.  HENT:     Right Ear: Tympanic membrane, ear canal and external ear normal.     Left Ear: Tympanic membrane, ear canal and external ear normal.     Mouth/Throat:      Mouth: Mucous membranes are moist.     Pharynx: Oropharynx is clear.  Cardiovascular:     Rate and Rhythm: Normal rate and regular rhythm.     Pulses: Normal pulses.     Heart sounds: Normal heart sounds.  Pulmonary:     Effort: Pulmonary effort is normal.     Breath sounds: Normal breath sounds.  Abdominal:     General: Bowel sounds are normal.  Musculoskeletal:        General: Normal range of motion.  Skin:    General: Skin is warm and dry.  Neurological:     General: No focal deficit present.     Mental Status: She is alert and oriented to person, place, and time.  Psychiatric:        Mood and Affect: Mood normal.        Behavior: Behavior normal.      No results found for any visits on 11/19/21.     The ASCVD Risk score (Arnett DK, et al., 2019) failed to calculate for the following reasons:   The 2019 ASCVD risk score is only valid for ages 21 to 61    Assessment & Plan:   Problem List Items Addressed This Visit       Respiratory   Asthma    Controlled except in cold weather.   Continue Albuterol sulfate 108 mcg as needed.         Digestive   Gastroesophageal reflux disease    Overall controlled with mild symptoms.   Start Famotidine 20 mg over the counter as needed.   Will continue to monitor.        Other   Frequent headaches    Controlled.   Continue Excedrin Migraine PRN, uses infrequently.       Relevant Medications   escitalopram (LEXAPRO) 10 MG tablet   Encounter for annual general medical examination with abnormal findings in adult - Primary    Immunizations UTD Pap smear UTD  Discussed the importance of a healthy diet and regular exercise in order for weight loss, and to reduce the risk of further co-morbidity.   Exam today unremarkable.   CBC, CMP, Lipid panel pending.       Relevant Orders   CBC   Comprehensive metabolic panel   Lipid panel   GAD (generalized anxiety disorder)    Uncontrolled. GAD 7 score is 11 today.    Will start Escitalopram 10 mg daily.   She will update me 4-6 weeks, or sooner if needed.      Relevant Medications   escitalopram (LEXAPRO) 10 MG tablet   Abnormal TSH    Slightly elevated TSH two years ago.   Given her symptoms of exhaustion, will repeat TSH and T3, free.  Relevant Orders   T3, Free   TSH   Other Visit Diagnoses     Need for immunization against influenza       Relevant Orders   Flu Vaccine QUAD 49moIM (Fluarix, Fluzone & Alfiuria Quad PF) (Completed)       No follow-ups on file.    BTinnie Gens BSN-RN, DNP STUDENT

## 2021-11-19 NOTE — Assessment & Plan Note (Addendum)
Immunizations UTD. Influenza vaccine provided today.  Pap smear UTD  Discussed the importance of a healthy diet and regular exercise in order for weight loss, and to reduce the risk of further co-morbidity.   Exam today as noted.  CBC, CMP, Thyroid studies, Lipid panel pending.   I evaluated patient, was consulted regarding treatment, and agree with assessment and plan per Tinnie Gens, RN, DNP student.   Allie Bossier, NP-C

## 2021-11-19 NOTE — Progress Notes (Signed)
Subjective:    Patient ID: Jaclyn Bell, female    DOB: 1995/08/11, 26 y.o.   MRN: [id number]  HPI  Jaclyn Bell is a very pleasant 26 y.o. female who presents today for complete physical and follow up of chronic conditions.  She would also like to discuss anxiety. Currently prescribed sertraline 50 mg daily for which she stopped taking 6 months ago due to side effects of "vivid dreams" and "it made me not care about anything". She also forgot to take her dose at night. She would like to resume treatment for symptoms of feeling anxious.   Immunizations: -Tetanus:2018 -Influenza: Due today  -HPV: Completed series  Diet: Fair diet.  Exercise: No regular exercise.  Eye exam: Completes annually  Dental exam: Completes semi-annually   Pap Smear: Completed in 2023 per GYN  BP Readings from Last 3 Encounters:  11/19/21 110/80  10/27/21 123/82  06/19/21 116/74        Review of Systems  Constitutional:  Negative for unexpected weight change.  HENT:  Negative for rhinorrhea.   Respiratory:  Negative for cough and shortness of breath.   Cardiovascular:  Negative for chest pain.  Gastrointestinal:  Negative for constipation and diarrhea.  Genitourinary:  Negative for difficulty urinating and menstrual problem.  Musculoskeletal:  Negative for arthralgias and myalgias.  Skin:  Negative for rash.  Allergic/Immunologic: Negative for environmental allergies.  Neurological:  Negative for dizziness and headaches.  Psychiatric/Behavioral:  The patient is nervous/anxious.        See HPI         Past Medical History:  Diagnosis Date   Genital herpes simplex type 1 infection 03/18/2017   Hives    Persistent cough 03/05/2021   Rash and nonspecific skin eruption 08/01/2020    Social History   Socioeconomic History   Marital status: Single    Spouse name: Not on file   Number of children: Not on file   Years of education: Not on file   Highest education level: Not on file   Occupational History   Not on file  Tobacco Use   Smoking status: Never   Smokeless tobacco: Never  Vaping Use   Vaping Use: Never used  Substance and Sexual Activity   Alcohol use: Yes    Comment: occ   Drug use: No   Sexual activity: Yes    Birth control/protection: Pill  Other Topics Concern   Not on file  Social History Narrative   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   Lives with her mother and younger sister.   Enjoys shopping.   Social Determinants of Health   Financial Resource Strain: Not on file  Food Insecurity: Not on file  Transportation Needs: Not on file  Physical Activity: Not on file  Stress: Not on file  Social Connections: Not on file  Intimate Partner Violence: Not on file    Past Surgical History:  Procedure Laterality Date   FOOT SURGERY     pin in place   TONSILLECTOMY AND ADENOIDECTOMY     TUBES IN EARS Bilateral     Family History  Problem Relation Age of Onset   Hypertension Mother    Mental illness Father    Cancer Maternal Grandmother     No Known Allergies  Current Outpatient Medications on File Prior to Visit  Medication Sig Dispense Refill   albuterol (VENTOLIN HFA) 108 (90 Base) MCG/ACT inhaler Inhale 2 puffs into the lungs every 4 (  four) hours as needed for shortness of breath. 1 each 0   ALLERGY RELIEF 180 MG tablet Take 180 mg by mouth daily.     clotrimazole (LOTRIMIN) 1 % cream Apply topically 2 (two) times daily.     norgestimate-ethinyl estradiol (ESTARYLLA) 0.25-35 MG-MCG tablet TAKE 1 TABLET BY MOUTH EVERY DAY, TAKE ONLY THE ACTIVE HORMONAL PILLS CONTINUOUSLY AS DIRECTED 84 tablet 4   levocetirizine (XYZAL) 5 MG tablet Take 5 mg by mouth 2 (two) times daily.     No current facility-administered medications on file prior to visit.    BP 110/80   Pulse 85   Temp 98.1 F (36.7 C) (Temporal)   Ht 5' 2.25" (1.581 m)   Wt 157 lb (71.2 kg)   LMP 09/29/2021 (Exact Date)   SpO2 98%   BMI 28.49  kg/m  Objective:   Physical Exam HENT:     Right Ear: Tympanic membrane and ear canal normal.     Left Ear: Tympanic membrane and ear canal normal.     Nose: Nose normal.  Eyes:     Conjunctiva/sclera: Conjunctivae normal.     Pupils: Pupils are equal, round, and reactive to light.  Neck:     Thyroid: No thyromegaly.  Cardiovascular:     Rate and Rhythm: Normal rate and regular rhythm.     Heart sounds: No murmur heard. Pulmonary:     Effort: Pulmonary effort is normal.     Breath sounds: Normal breath sounds. No rales.  Abdominal:     General: Bowel sounds are normal.     Palpations: Abdomen is soft.     Tenderness: There is no abdominal tenderness.  Musculoskeletal:        General: Normal range of motion.     Cervical back: Neck supple.  Lymphadenopathy:     Cervical: No cervical adenopathy.  Skin:    General: Skin is warm and dry.     Findings: No rash.  Neurological:     Mental Status: She is alert and oriented to person, place, and time.     Cranial Nerves: No cranial nerve deficit.     Deep Tendon Reflexes: Reflexes are normal and symmetric.  Psychiatric:        Mood and Affect: Mood normal.           Assessment & Plan:   Problem List Items Addressed This Visit       Respiratory   Asthma    Controlled except in cold weather.   Continue Albuterol sulfate 108 mcg as needed.   I evaluated patient, was consulted regarding treatment, and agree with assessment and plan per Tinnie Gens, RN, DNP student.   Allie Bossier, NP-C         Digestive   Gastroesophageal reflux disease    Overall controlled with mild and intermittent symptoms.  Tums ineffective.  Start Famotidine 20 mg over the counter as needed.  Prefer she remain off pantoprazole.   She will update if symptoms persist.   I evaluated patient, was consulted regarding treatment, and agree with assessment and plan per Tinnie Gens, RN, DNP student.   Allie Bossier, NP-C         Other    Frequent headaches    Controlled.   Continue Excedrin Migraine PRN, uses infrequently.   I evaluated patient, was consulted regarding treatment, and agree with assessment and plan per Tinnie Gens, RN, DNP student.   Allie Bossier, NP-C       Relevant  Medications   escitalopram (LEXAPRO) 10 MG tablet   Encounter for annual general medical examination with abnormal findings in adult - Primary    Immunizations UTD Pap smear UTD  Discussed the importance of a healthy diet and regular exercise in order for weight loss, and to reduce the risk of further co-morbidity.   Exam today as noted.  CBC, CMP, Thyroid studies, Lipid panel pending.   I evaluated patient, was consulted regarding treatment, and agree with assessment and plan per Tinnie Gens, RN, DNP student.   Allie Bossier, NP-C       Relevant Orders   CBC   Comprehensive metabolic panel   Lipid panel   GAD (generalized anxiety disorder)    Uncontrolled. GAD 7 score is 11 today.  Sertraline caused side effects.  Will start Escitalopram 10 mg daily, 1/2 tablet daily x 1 week, then 1 full tablet thereafter.  She will update me 4-6 weeks, or sooner if needed.  I evaluated patient, was consulted regarding treatment, and agree with assessment and plan per Tinnie Gens, RN, DNP student.   Allie Bossier, NP-C       Relevant Medications   escitalopram (LEXAPRO) 10 MG tablet   Abnormal TSH    Slightly elevated TSH two years ago.   Given her symptoms of exhaustion, will repeat TSH, Free T4.  I evaluated patient, was consulted regarding treatment, and agree with assessment and plan per Tinnie Gens, RN, DNP student.   Allie Bossier, NP-C        Relevant Orders   T3, Free   TSH   Other Visit Diagnoses     Need for immunization against influenza       Relevant Orders   Flu Vaccine QUAD 22moIM (Fluarix, Fluzone & Alfiuria Quad PF) (Completed)          KPleas Koch NP

## 2021-11-19 NOTE — Addendum Note (Signed)
Addended by: Ellamae Sia on: 11/19/2021 10:20 AM   Modules accepted: Orders

## 2021-11-19 NOTE — Assessment & Plan Note (Addendum)
Controlled except in cold weather.   Continue Albuterol sulfate 108 mcg as needed.   I evaluated patient, was consulted regarding treatment, and agree with assessment and plan per Tinnie Gens, RN, DNP student.   Allie Bossier, NP-C

## 2021-11-19 NOTE — Assessment & Plan Note (Addendum)
Overall controlled with mild and intermittent symptoms.  Tums ineffective.  Start Famotidine 20 mg over the counter as needed.  Prefer she remain off pantoprazole.   She will update if symptoms persist.   I evaluated patient, was consulted regarding treatment, and agree with assessment and plan per Tinnie Gens, RN, DNP student.   Allie Bossier, NP-C

## 2021-11-19 NOTE — Assessment & Plan Note (Addendum)
Slightly elevated TSH two years ago.   Given her symptoms of exhaustion, will repeat TSH, Free T4.  I evaluated patient, was consulted regarding treatment, and agree with assessment and plan per Tinnie Gens, RN, DNP student.   Allie Bossier, NP-C

## 2021-11-20 LAB — CBC
Hematocrit: 43.5 % (ref 34.0–46.6)
Hemoglobin: 14.4 g/dL (ref 11.1–15.9)
MCH: 29.6 pg (ref 26.6–33.0)
MCHC: 33.1 g/dL (ref 31.5–35.7)
MCV: 89 fL (ref 79–97)
Platelets: 237 10*3/uL (ref 150–450)
RBC: 4.87 x10E6/uL (ref 3.77–5.28)
RDW: 11.5 % — ABNORMAL LOW (ref 11.7–15.4)
WBC: 9.1 10*3/uL (ref 3.4–10.8)

## 2021-11-20 LAB — COMPREHENSIVE METABOLIC PANEL
ALT: 16 IU/L (ref 0–32)
AST: 17 IU/L (ref 0–40)
Albumin/Globulin Ratio: 1.8 (ref 1.2–2.2)
Albumin: 4.2 g/dL (ref 4.0–5.0)
Alkaline Phosphatase: 85 IU/L (ref 44–121)
BUN/Creatinine Ratio: 16 (ref 9–23)
BUN: 8 mg/dL (ref 6–20)
Bilirubin Total: 0.2 mg/dL (ref 0.0–1.2)
CO2: 22 mmol/L (ref 20–29)
Calcium: 9.1 mg/dL (ref 8.7–10.2)
Chloride: 102 mmol/L (ref 96–106)
Creatinine, Ser: 0.49 mg/dL — ABNORMAL LOW (ref 0.57–1.00)
Globulin, Total: 2.3 g/dL (ref 1.5–4.5)
Glucose: 89 mg/dL (ref 70–99)
Potassium: 4 mmol/L (ref 3.5–5.2)
Sodium: 138 mmol/L (ref 134–144)
Total Protein: 6.5 g/dL (ref 6.0–8.5)
eGFR: 133 mL/min/{1.73_m2} (ref >59)

## 2021-11-20 LAB — LIPID PANEL
Chol/HDL Ratio: 3.6 ratio (ref 0.0–4.4)
Cholesterol, Total: 199 mg/dL (ref 100–199)
HDL: 56 mg/dL (ref >39)
LDL Chol Calc (NIH): 126 mg/dL — ABNORMAL HIGH (ref 0–99)
Triglycerides: 95 mg/dL (ref 0–149)
VLDL Cholesterol Cal: 17 mg/dL (ref 5–40)

## 2021-11-20 LAB — T4, FREE

## 2021-11-20 LAB — T3, FREE: T3, Free: 3.3 pg/mL (ref 2.0–4.4)

## 2021-11-20 LAB — TSH: TSH: 1.7 u[IU]/mL (ref 0.450–4.500)

## 2021-11-20 LAB — SPECIMEN STATUS REPORT

## 2021-11-21 LAB — SPECIMEN STATUS REPORT

## 2021-11-21 LAB — T4, FREE: Free T4: 1.05 ng/dL (ref 0.82–1.77)

## 2021-11-26 ENCOUNTER — Encounter: Payer: Self-pay | Admitting: Primary Care

## 2021-11-26 ENCOUNTER — Ambulatory Visit: Payer: Managed Care, Other (non HMO) | Admitting: Primary Care

## 2021-11-26 VITALS — BP 120/72 | HR 90 | Temp 99.3°F | Ht 62.5 in | Wt 158.0 lb

## 2021-11-26 DIAGNOSIS — R051 Acute cough: Secondary | ICD-10-CM | POA: Insufficient documentation

## 2021-11-26 DIAGNOSIS — B379 Candidiasis, unspecified: Secondary | ICD-10-CM | POA: Diagnosis not present

## 2021-11-26 DIAGNOSIS — J019 Acute sinusitis, unspecified: Secondary | ICD-10-CM | POA: Insufficient documentation

## 2021-11-26 DIAGNOSIS — H1033 Unspecified acute conjunctivitis, bilateral: Secondary | ICD-10-CM | POA: Diagnosis not present

## 2021-11-26 DIAGNOSIS — T3695XA Adverse effect of unspecified systemic antibiotic, initial encounter: Secondary | ICD-10-CM

## 2021-11-26 LAB — POCT INFLUENZA A/B
Influenza A, POC: NEGATIVE
Influenza B, POC: NEGATIVE

## 2021-11-26 LAB — POC COVID19 BINAXNOW: SARS Coronavirus 2 Ag: NEGATIVE

## 2021-11-26 MED ORDER — FLUCONAZOLE 150 MG PO TABS: 150.0000 mg | ORAL_TABLET | Freq: Once | ORAL | 0 refills | Status: AC

## 2021-11-26 MED ORDER — AMOXICILLIN-POT CLAVULANATE 875-125 MG PO TABS: 1.0000 | ORAL_TABLET | Freq: Two times a day (BID) | ORAL | 0 refills | Status: DC

## 2021-11-26 NOTE — Assessment & Plan Note (Signed)
Improving.   Continue Sulfacetamide Sodium Ophthalmic solution eye drops as prescribed.   She will update.

## 2021-11-26 NOTE — Progress Notes (Signed)
Subjective:    Patient ID: Jaclyn Bell, female    DOB: 1995/03/15, 26 y.o.   MRN: [id number]  Sinus Problem Associated symptoms include chills, congestion, coughing and sinus pressure. Pertinent negatives include no shortness of breath.    Jaclyn Bell is a very pleasant 26 y.o. female with a history of GERD, allergic rhinitis, asthma who presents today to discuss cough.  Symptom onset 11/19/2021 with sore throat. She then began to experience a dry cough, fatigue, headaches, nasal congestion, and sinus pressure. Three days ago she developed itchy and red eyes, completed a teledoc visit who diagnosed her with pink eye and prescribed Sulfa based antibiotic drops.   She's taken Mucinex DM, Robitussin, and Nyquil with temporary improvement. She's had to use her albuterol inhaler a few times with improvement in chest tightness. Overall she's feeling worse. Since in her office she has felt very warm and clammy.   She tested negative for Covid-19 this morning with a home test. She denies other sick contacts but did substitute teach three days ago.    Review of Systems  Constitutional:  Positive for chills and fatigue.  HENT:  Positive for congestion and sinus pressure.   Respiratory:  Positive for cough and chest tightness. Negative for shortness of breath.          Past Medical History:  Diagnosis Date   Genital herpes simplex type 1 infection 03/18/2017   Hives    Persistent cough 03/05/2021   Rash and nonspecific skin eruption 08/01/2020    Social History   Socioeconomic History   Marital status: Single    Spouse name: Not on file   Number of children: Not on file   Years of education: Not on file   Highest education level: Not on file  Occupational History   Not on file  Tobacco Use   Smoking status: Never   Smokeless tobacco: Never  Vaping Use   Vaping Use: Never used  Substance and Sexual Activity   Alcohol use: Yes    Comment: occ   Drug use: No   Sexual  activity: Yes    Birth control/protection: Pill  Other Topics Concern   Not on file  Social History Narrative   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   Lives with her mother and younger sister.   Enjoys shopping.   Social Determinants of Health   Financial Resource Strain: Not on file  Food Insecurity: Not on file  Transportation Needs: Not on file  Physical Activity: Not on file  Stress: Not on file  Social Connections: Not on file  Intimate Partner Violence: Not on file    Past Surgical History:  Procedure Laterality Date   FOOT SURGERY     pin in place   TONSILLECTOMY AND ADENOIDECTOMY     TUBES IN EARS Bilateral     Family History  Problem Relation Age of Onset   Hypertension Mother    Mental illness Father    Cancer Maternal Grandmother     No Known Allergies  Current Outpatient Medications on File Prior to Visit  Medication Sig Dispense Refill   albuterol (VENTOLIN HFA) 108 (90 Base) MCG/ACT inhaler Inhale 2 puffs into the lungs every 4 (four) hours as needed for shortness of breath. 1 each 0   ALLERGY RELIEF 180 MG tablet Take 180 mg by mouth daily.     clotrimazole (LOTRIMIN) 1 % cream Apply topically 2 (two) times daily.  levocetirizine (XYZAL) 5 MG tablet Take 5 mg by mouth 2 (two) times daily.     norgestimate-ethinyl estradiol (ESTARYLLA) 0.25-35 MG-MCG tablet TAKE 1 TABLET BY MOUTH EVERY DAY, TAKE ONLY THE ACTIVE HORMONAL PILLS CONTINUOUSLY AS DIRECTED 84 tablet 4   sulfacetamide (BLEPH-10) 10 % ophthalmic ointment every 6 (six) hours.     escitalopram (LEXAPRO) 10 MG tablet Take 1 tablet (10 mg total) by mouth daily. For anxiety (Patient not taking: Reported on 11/26/2021) 90 tablet 0   No current facility-administered medications on file prior to visit.    BP 120/72   Pulse 90   Temp 99.3 F (37.4 C) (Temporal)   Ht 5' 2.5" (1.588 m)   Wt 158 lb (71.7 kg)   LMP 09/29/2021 (Exact Date)   SpO2 99%   BMI 28.44 kg/m   Objective:   Physical Exam Constitutional:      Appearance: She is ill-appearing.     Comments: Warm and clammy skin  HENT:     Right Ear: Tympanic membrane and ear canal normal.     Left Ear: Tympanic membrane and ear canal normal.     Nose:     Right Sinus: Maxillary sinus tenderness present. No frontal sinus tenderness.     Left Sinus: No maxillary sinus tenderness or frontal sinus tenderness.     Mouth/Throat:     Pharynx: No posterior oropharyngeal erythema.  Eyes:     Conjunctiva/sclera: Conjunctivae normal.  Cardiovascular:     Rate and Rhythm: Normal rate and regular rhythm.  Pulmonary:     Effort: Pulmonary effort is normal.     Breath sounds: Normal breath sounds. No wheezing or rales.  Musculoskeletal:     Cervical back: Neck supple.  Lymphadenopathy:     Cervical: No cervical adenopathy.  Skin:    General: Skin is warm and dry.           Assessment & Plan:   Problem List Items Addressed This Visit       Respiratory   Acute sinusitis    Warm and clammy on physical exam.   Given that her symptoms has been going on for seven days, will treat with Augmentin 875-'125mg'$  BID for seven days.   She will update, if symptoms do not improve or worsen.      Relevant Medications   amoxicillin-clavulanate (AUGMENTIN) 875-125 MG tablet   fluconazole (DIFLUCAN) 150 MG tablet     Other   Acute cough - Primary    Negative for Covid, Influenza A&B.   Given duration of symptoms, coupled with presentation, will treat with Augmentin BID x 7 day course.  Continue over the counter Robitussin and Mucinex DM as needed.  She will update if symptoms do not resolve.  I evaluated patient, was consulted regarding treatment, and agree with assessment and plan per Tinnie Gens, RN, DNP student.   Allie Bossier, NP-C       Relevant Medications   amoxicillin-clavulanate (AUGMENTIN) 875-125 MG tablet   Other Relevant Orders   POC COVID-19 (Completed)   Influenza A/B  (Completed)   Antibiotic-induced yeast infection    Given her history, Will send in prescription for prophylactic fluconazole.       Relevant Medications   fluconazole (DIFLUCAN) 150 MG tablet   Acute conjunctivitis of both eyes    Improving.   Continue Sulfacetamide Sodium Ophthalmic solution eye drops as prescribed.   She will update.           Pleas Koch,  NP

## 2021-11-26 NOTE — Assessment & Plan Note (Signed)
Warm and clammy on physical exam.   Given that her symptoms has been going on for seven days, will treat with Augmentin 875-'125mg'$  BID for seven days.   She will update, if symptoms do not improve or worsen.

## 2021-11-26 NOTE — Assessment & Plan Note (Addendum)
Negative for Covid, Influenza A&B.   Given duration of symptoms, coupled with presentation, will treat with Augmentin BID x 7 day course.  Continue over the counter Robitussin and Mucinex DM as needed.  She will update if symptoms do not resolve.  I evaluated patient, was consulted regarding treatment, and agree with assessment and plan per Tinnie Gens, RN, DNP student.   Allie Bossier, NP-C

## 2021-11-26 NOTE — Progress Notes (Signed)
Established Patient Office Visit  Subjective   Patient ID: Jaclyn Bell, female    DOB: Sep 24, 1995  Age: 26 y.o. MRN: [id number]  Chief Complaint  Patient presents with   Sinus Problem    Sx started 10/06 Dry cough, sore throat, headaches, congestion, itchy and burning eyes     Sinus Problem Associated symptoms include chills, congestion, coughing, ear pain, headaches and a sore throat. Pertinent negatives include no neck pain or shortness of breath.    She had a sore throat on 11/19/21. Cough started Sunday night. She is coughing stuff up at times. She noticed blood on her tissue when she blows her nose. Congestion, headaches started Friday. She has sinus pressure and night time cough has been the most irritating. Both of her ears popping.  She tried Robitussin, Nyquil and mucinex Dm and they do seem to give some relief but then symptoms returned. She did use her Albuterol three times on Saturday. She does take Allegra and Xyzal daily for allergies.  She did substitute on Monday and started getting itchy and watery eyes on Tuesday. She contacted a doctor on [facility] on tuesday due to concern of pink eye and she was prescribed Sulfacetamide Sodium Ophthalmic Solution. She has only used it once and said did take the itching away.   She did a home Covid test today which was negative. She had the flu shot last Thursday. She does not recall being around anyone sick other than at the school.   She denies any fever, chills, nausea, vomiting, post-nasal drainage or body aches. Last dose of Mucinex DM this morning.      Patient Active Problem List   Diagnosis Date Noted   Acute cough 11/26/2021   Antibiotic-induced yeast infection 11/26/2021   Acute sinusitis 11/26/2021   Acute conjunctivitis of both eyes 11/26/2021   Abnormal TSH 11/19/2021   Gastroesophageal reflux disease 11/19/2021   Asthma 03/05/2021   GAD (generalized anxiety disorder) 08/01/2020   Frequent headaches 11/09/2019    Encounter for annual general medical examination with abnormal findings in adult 11/09/2019   Lower extremity numbness 08/16/2018   Dysmenorrhea 11/11/2017   Fatigue 11/11/2017   Acne 10/10/2013   Menorrhagia 12/01/2012   ALLERGIC RHINITIS, SEASONAL 05/15/2007   Past Medical History:  Diagnosis Date   Genital herpes simplex type 1 infection 03/18/2017   Hives    Persistent cough 03/05/2021   Rash and nonspecific skin eruption 08/01/2020   Past Surgical History:  Procedure Laterality Date   FOOT SURGERY     pin in place   TONSILLECTOMY AND ADENOIDECTOMY     TUBES IN EARS Bilateral    Family Status  Relation Name Status   Mother  Alive   Father  Alive   MGM  (Not Specified)   Family History  Problem Relation Age of Onset   Hypertension Mother    Mental illness Father    Cancer Maternal Grandmother       Review of Systems  Constitutional:  Positive for chills, fever and malaise/fatigue.  HENT:  Positive for congestion, ear pain, nosebleeds, sinus pain and sore throat.   Eyes:  Positive for pain, discharge and redness. Negative for blurred vision and double vision.  Respiratory:  Positive for cough. Negative for shortness of breath and wheezing.   Cardiovascular:  Negative for chest pain and palpitations.  Musculoskeletal:  Negative for back pain, joint pain and neck pain.  Neurological:  Positive for headaches. Negative for dizziness.  Objective:     BP 120/72   Pulse 90   Temp 99.3 F (37.4 C) (Temporal)   Ht 5' 2.5" (1.588 m)   Wt 158 lb (71.7 kg)   LMP 09/29/2021 (Exact Date)   SpO2 99%   BMI 28.44 kg/m  BP Readings from Last 3 Encounters:  11/26/21 120/72  11/19/21 110/80  10/27/21 123/82   Wt Readings from Last 3 Encounters:  11/26/21 158 lb (71.7 kg)  11/19/21 157 lb (71.2 kg)  10/27/21 157 lb (71.2 kg)      Physical Exam Vitals reviewed.  Constitutional:      Appearance: Normal appearance.  HENT:     Right Ear: Tympanic membrane,  ear canal and external ear normal.     Left Ear: Tympanic membrane, ear canal and external ear normal.     Nose: Congestion present.     Right Sinus: Maxillary sinus tenderness and frontal sinus tenderness present.     Left Sinus: Maxillary sinus tenderness and frontal sinus tenderness present.     Mouth/Throat:     Mouth: Mucous membranes are moist.  Eyes:     General:        Right eye: Discharge present.        Left eye: Discharge present. Cardiovascular:     Rate and Rhythm: Normal rate and regular rhythm.     Pulses: Normal pulses.     Heart sounds: Normal heart sounds.  Pulmonary:     Effort: Pulmonary effort is normal.     Breath sounds: Normal breath sounds.  Skin:    General: Skin is warm.  Neurological:     Mental Status: She is alert and oriented to person, place, and time.  Psychiatric:        Mood and Affect: Mood normal.        Behavior: Behavior normal.      Results for orders placed or performed in visit on 11/26/21  POC COVID-19  Result Value Ref Range   SARS Coronavirus 2 Ag Negative Negative  Influenza A/B  Result Value Ref Range   Influenza A, POC Negative Negative   Influenza B, POC Negative Negative       The ASCVD Risk score (Arnett DK, et al., 2019) failed to calculate for the following reasons:   The 2019 ASCVD risk score is only valid for ages 13 to 32    Assessment & Plan:   Problem List Items Addressed This Visit       Respiratory   Acute sinusitis    Warm and clammy on physical exam.   Given that her symptoms has been going on for seven days, will treat with Augmentin 875-'125mg'$  BID for seven days.   She will update, if symptoms do not improve or worsen.      Relevant Medications   amoxicillin-clavulanate (AUGMENTIN) 875-125 MG tablet   fluconazole (DIFLUCAN) 150 MG tablet     Other   Acute cough - Primary    Negative for Covid, Influenza A&B.    Continue over the counter Robitussin and Mucinex DM as needed.  She will  update if symptoms do not resolve.       Relevant Medications   amoxicillin-clavulanate (AUGMENTIN) 875-125 MG tablet   Other Relevant Orders   POC COVID-19 (Completed)   Influenza A/B (Completed)   Antibiotic-induced yeast infection    Given her history, Will send in prescription for prophylactic fluconazole.       Relevant Medications   fluconazole (DIFLUCAN) 150  MG tablet   Acute conjunctivitis of both eyes    Improving.   Continue Sulfacetamide Sodium Ophthalmic solution eye drops as prescribed.   She will update.        No follow-ups on file.    Tinnie Gens, BSN-RN, DNP STUDENT

## 2021-11-26 NOTE — Assessment & Plan Note (Signed)
Given her history, Will send in prescription for prophylactic fluconazole.

## 2021-12-05 ENCOUNTER — Other Ambulatory Visit: Payer: Self-pay | Admitting: Obstetrics & Gynecology

## 2021-12-05 DIAGNOSIS — Z3041 Encounter for surveillance of contraceptive pills: Secondary | ICD-10-CM

## 2021-12-14 ENCOUNTER — Other Ambulatory Visit: Payer: Self-pay | Admitting: *Deleted

## 2021-12-14 DIAGNOSIS — Z3041 Encounter for surveillance of contraceptive pills: Secondary | ICD-10-CM

## 2021-12-14 MED ORDER — NORGESTIMATE-ETH ESTRADIOL 0.25-35 MG-MCG PO TABS: ORAL_TABLET | ORAL | 4 refills | Status: DC

## 2022-02-17 ENCOUNTER — Other Ambulatory Visit: Payer: Self-pay | Admitting: Primary Care

## 2022-02-17 DIAGNOSIS — F411 Generalized anxiety disorder: Secondary | ICD-10-CM

## 2022-02-19 ENCOUNTER — Telehealth: Payer: Self-pay | Admitting: Primary Care

## 2022-02-19 DIAGNOSIS — F411 Generalized anxiety disorder: Secondary | ICD-10-CM

## 2022-02-19 MED ORDER — ESCITALOPRAM OXALATE 10 MG PO TABS: ORAL_TABLET | ORAL | 8 refills | Status: DC

## 2022-02-19 NOTE — Telephone Encounter (Signed)
Noted, Rx changed.

## 2022-02-19 NOTE — Telephone Encounter (Signed)
Due to her change in insurance,patient needs a 30 day supply sent to  [facility], [place] Phone: [phone]  Fax: [phone]    For medication escitalopram (LEXAPRO) 10 MG tablet patient's name stated that this prescription has to be sent in 30 days at a time instead of 90 days.

## 2022-02-22 ENCOUNTER — Other Ambulatory Visit: Payer: Self-pay

## 2022-02-22 DIAGNOSIS — Z3041 Encounter for surveillance of contraceptive pills: Secondary | ICD-10-CM

## 2022-02-22 MED ORDER — NORGESTIMATE-ETH ESTRADIOL 0.25-35 MG-MCG PO TABS: ORAL_TABLET | ORAL | 4 refills | Status: DC

## 2022-02-22 NOTE — Progress Notes (Signed)
Rx sent in as requested for [facility] pills.

## 2022-03-25 DIAGNOSIS — L508 Other urticaria: Secondary | ICD-10-CM | POA: Diagnosis not present

## 2022-03-29 DIAGNOSIS — L508 Other urticaria: Secondary | ICD-10-CM | POA: Diagnosis not present

## 2022-04-19 ENCOUNTER — Telehealth: Payer: Self-pay | Admitting: Obstetrics & Gynecology

## 2022-04-19 NOTE — Telephone Encounter (Signed)
This pt called about still bleeding for 3 wk when she wipes she wants to be seen as soon as possible

## 2022-04-22 ENCOUNTER — Ambulatory Visit: Payer: 59 | Admitting: Advanced Practice Midwife

## 2022-04-22 ENCOUNTER — Encounter: Payer: Self-pay | Admitting: Advanced Practice Midwife

## 2022-04-22 ENCOUNTER — Other Ambulatory Visit (HOSPITAL_COMMUNITY)
Admission: RE | Admit: 2022-04-22 | Discharge: 2022-04-22 | Disposition: A | Discharge status: Home / Self Care | Payer: 59 | Source: Ambulatory Visit | Attending: Advanced Practice Midwife | Admitting: Advanced Practice Midwife

## 2022-04-22 VITALS — BP 134/88 | HR 88 | Wt 165.0 lb

## 2022-04-22 DIAGNOSIS — N939 Abnormal uterine and vaginal bleeding, unspecified: Secondary | ICD-10-CM | POA: Diagnosis not present

## 2022-04-22 DIAGNOSIS — R109 Unspecified abdominal pain: Secondary | ICD-10-CM

## 2022-04-22 DIAGNOSIS — N921 Excessive and frequent menstruation with irregular cycle: Secondary | ICD-10-CM

## 2022-04-22 LAB — POCT URINALYSIS DIPSTICK
Blood, UA: NEGATIVE
Leukocytes, UA: NEGATIVE
Nitrite, UA: NEGATIVE

## 2022-04-22 LAB — POCT URINE PREGNANCY: Preg Test, Ur: NEGATIVE

## 2022-04-22 NOTE — Progress Notes (Signed)
Having blood only when she wipes x 3 weeks Nothing on underwear. No blood in toilet when she uses bathroom   Left back pain   Started new allergy shot around the same time

## 2022-04-24 NOTE — Progress Notes (Signed)
GYNECOLOGY PROBLEM CARE ENCOUNTER NOTE  History:     Jaclyn Bell is a 27 y.o. G0P0000 female. Current complaints: 3 weeks of vaginal spotting. She only sees smears of blood when she wipes after voiding.   Denies abnormal vaginal bleeding, discharge, pelvic pain, problems with intercourse or other gynecologic concerns.   Patient states she has been googling and expresses concern for acute causes including unplanned pregnancy. She states she is comfortable with her OCPs. Has been on and off her current pills for the past few years. Consistently experiences some level of breakthrough bleeding with them but this is not a problem or inconvenience for her.   Patient also complains of left flank pain. She is not experiencing dysuria, frequency, hematuria, or fever.   Gynecologic History No LMP recorded. (Menstrual status: Oral contraceptives). Contraception: OCP (estrogen/progesterone) Last Pap: 10/2021. Result was normal with negative HPV   Obstetric History OB History  Gravida Para Term Preterm AB Living  0 0 0 0 0 0  SAB IAB Ectopic Multiple Live Births  0 0 0 0 0    Past Medical History:  Diagnosis Date   Genital herpes simplex type 1 infection 03/18/2017   Hives    Persistent cough 03/05/2021   Rash and nonspecific skin eruption 08/01/2020    Past Surgical History:  Procedure Laterality Date   FOOT SURGERY     pin in place   TONSILLECTOMY AND ADENOIDECTOMY     TUBES IN EARS Bilateral     Current Outpatient Medications on File Prior to Visit  Medication Sig Dispense Refill   escitalopram (LEXAPRO) 10 MG tablet TAKE 1 TABLET(10 MG) BY MOUTH DAILY FOR ANXIETY 30 tablet 8   norgestimate-ethinyl estradiol (ESTARYLLA) 0.25-35 MG-MCG tablet TAKE 1 TABLET BY MOUTH EVERY DAY. ACTIVE TABLETS ONLY AS DIRECTED 84 tablet 4   Omalizumab (XOLAIR [place]) Inject into the skin every 30 (thirty) days.     albuterol (VENTOLIN HFA) 108 (90 Base) MCG/ACT inhaler Inhale 2 puffs into the lungs  every 4 (four) hours as needed for shortness of breath. 1 each 0   ALLERGY RELIEF 180 MG tablet Take 180 mg by mouth daily.     amoxicillin-clavulanate (AUGMENTIN) 875-125 MG tablet Take 1 tablet by mouth 2 (two) times daily. For sinus infection. 14 tablet 0   clotrimazole (LOTRIMIN) 1 % cream Apply topically 2 (two) times daily.     levocetirizine (XYZAL) 5 MG tablet Take 5 mg by mouth 2 (two) times daily.     sulfacetamide (BLEPH-10) 10 % ophthalmic ointment every 6 (six) hours.     No current facility-administered medications on file prior to visit.    No Known Allergies  Social History:  reports that she has never smoked. She has never used smokeless tobacco. She reports current alcohol use. She reports that she does not use drugs.  Family History  Problem Relation Age of Onset   Hypertension Mother    Mental illness Father    Cancer Maternal Grandmother     The following portions of the patient's history were reviewed and updated as appropriate: allergies, current medications, past family history, past medical history, past social history, past surgical history and problem list.  Review of Systems Pertinent items noted in HPI and remainder of comprehensive ROS otherwise negative.  Physical Exam:  BP 134/88   Pulse 88   Wt 165 lb (74.8 kg)   BMI 29.70 kg/m  CONSTITUTIONAL: Well-developed, well-nourished female in no acute distress.  HENT:  Normocephalic, atraumatic, External right and left ear normal.  EYES: Conjunctivae and EOM are normal. Pupils are equal, round, and reactive to light. No scleral icterus.  SKIN: Skin is warm and dry. No rash noted. Not diaphoretic. No erythema. No pallor. MUSCULOSKELETAL: Normal range of motion. No tenderness.  No cyanosis, clubbing, or edema. NEUROLOGIC: Alert and oriented to person, place, and time. Normal muscle tone coordination.  PSYCHIATRIC: Normal mood and affect. Normal behavior. Normal judgment and thought  content. CARDIOVASCULAR: Normal heart rate noted, regular rhythm RESPIRATORY: Clear to auscultation bilaterally. Effort and breath sounds normal, no problems with respiration noted. PELVIC: Normal appearing external genitalia and urethral meatus; normal appearing vaginal mucosa. 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 Tissue appears red and irritated, multiple very small areas of what looks like recently healed contact bleeding. Performed in the presence of a chaperone.   Assessment and Plan:    1. Vaginal spotting - Negative UPT in office - Chart review. Dr. Harolyn Rutherford documented significant cervical ectropion in her visit note from 10/27/2021 - Significant and prominent cervical ectropion visualized again today - Patient previously counseled about option for cryotherapy per Dr. Harolyn Rutherford - Apology offered to patient that I do not perform procedure, my knowledge of very specific components of procedure is limited - Advised to continue pelvic rest, consider scheduling with MD to discuss risks and benefits of cryotherapy - Cervicovaginal ancillary only - POCT urine pregnancy - POCT Urinalysis Dipstick  2. Complaint of flank pain - Negative for acute symptoms associated with UTI - Clean urine dip in office  3. Breakthrough bleeding on OCPs - Consistent with use since initiation, improving over time per patient report  Routine preventative health maintenance measures emphasized. Please refer to After Visit Summary for other counseling recommendations.     Patient now considering cryotherapy. Advised to scheduled with MD for additional discussion  Mallie Snooks, [place], MSN, CNM Certified Nurse Midwife, [facility] for [employer], [place]

## 2022-04-26 DIAGNOSIS — L508 Other urticaria: Secondary | ICD-10-CM | POA: Diagnosis not present

## 2022-04-26 LAB — CERVICOVAGINAL ANCILLARY ONLY
Bacterial Vaginitis (gardnerella): NEGATIVE
Candida Glabrata: NEGATIVE
Candida Vaginitis: NEGATIVE
Chlamydia: NEGATIVE
Comment: NEGATIVE
Comment: NEGATIVE
Comment: NEGATIVE
Comment: NEGATIVE
Comment: NEGATIVE
Comment: NORMAL
Neisseria Gonorrhea: NEGATIVE
Trichomonas: NEGATIVE

## 2022-05-26 DIAGNOSIS — L508 Other urticaria: Secondary | ICD-10-CM | POA: Diagnosis not present

## 2022-06-02 ENCOUNTER — Ambulatory Visit: Payer: 59 | Admitting: Obstetrics & Gynecology

## 2022-06-02 ENCOUNTER — Encounter: Payer: Self-pay | Admitting: Obstetrics & Gynecology

## 2022-06-02 VITALS — BP 125/84 | HR 87 | Wt 165.0 lb

## 2022-06-02 DIAGNOSIS — N86 Erosion and ectropion of cervix uteri: Secondary | ICD-10-CM | POA: Diagnosis not present

## 2022-06-02 DIAGNOSIS — Z3041 Encounter for surveillance of contraceptive pills: Secondary | ICD-10-CM | POA: Diagnosis not present

## 2022-06-02 MED ORDER — NORGESTIMATE-ETH ESTRADIOL 0.25-35 MG-MCG PO TABS: ORAL_TABLET | ORAL | 4 refills | Status: DC

## 2022-06-02 NOTE — Progress Notes (Signed)
   GYNECOLOGY OFFICE VISIT NOTE  History:   Jaclyn Bell is a 27 y.o. G0P0000 here today for discussion about management of her cervical ectropion and associated bleeding.  This was noticed during her 10/27/21 visit, she has a prominent ectropion.  She has continues to have bleeding after intercourse, or whenever she has any pressure next to her cervix. Also desires refill of her OCPs.  She denies any abnormal vaginal discharge, pelvic pain or other concerns. She is accompanied by her mother today.    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   Hives    Persistent cough 03/05/2021   Rash and nonspecific skin eruption 08/01/2020    Past Surgical History:  Procedure Laterality Date   FOOT SURGERY     pin in place   TONSILLECTOMY AND ADENOIDECTOMY     TUBES IN EARS Bilateral     The following portions of the patient's history were reviewed and updated as appropriate: allergies, current medications, past family history, past medical history, past social history, past surgical history and problem list.   Health Maintenance:  Normal pap on 07/10/2019.   Review of Systems:  Pertinent items noted in HPI and remainder of comprehensive ROS otherwise negative.  Physical Exam:  BP 125/84   Pulse 87   Wt 165 lb (74.8 kg)   BMI 29.70 kg/m  CONSTITUTIONAL: Well-developed, well-nourished female in no acute distress.  HEENT:  Normocephalic, atraumatic. External right and left ear normal. No scleral icterus.  NECK: Normal range of motion, supple, no masses noted on observation SKIN: No rash noted. Not diaphoretic. No erythema. No pallor. MUSCULOSKELETAL: Normal range of motion. No edema noted. NEUROLOGIC: Alert and oriented to person, place, and time. Normal muscle tone coordination. No cranial nerve deficit noted. PSYCHIATRIC: Normal mood and affect. Normal behavior. Normal judgment and thought content. CARDIOVASCULAR:  Normal heart rate noted RESPIRATORY: Effort and breath sounds normal, no problems with respiration noted ABDOMEN: No masses noted. No other overt distention noted.   PELVIC: Deferred    Assessment and Plan:    1. Oral contraceptive pill surveillance OCPs refilled - norgestimate-ethinyl estradiol (ESTARYLLA) 0.25-35 MG-MCG tablet; TAKE 1 TABLET BY MOUTH EVERY DAY. ACTIVE TABLETS ONLY AS DIRECTED  Dispense: 84 tablet; Refill: 4  2. Cervical ectropion Discussed management for symptomatic/bleeding due to cervical ectropion. Discussed cryotherapy procedure in detail, all questions answered.  Alternative would be LEEP. Talked about possible cervical shortening, implications for future pregnancies. Information given to her to review at home. She will return for cryotherapy soon, premedication with Ibuprofen and tylenol recommended. She also desires paracervical block prior to procedure.   Routine preventative health maintenance measures emphasized, will get pap at next visit prior to procedure. Please refer to After Visit Summary for other counseling recommendations.   Return for Cryotherapy procedure for cervical ectropion with Dr. Macon Large.    I spent 30 minutes dedicated to the care of this patient including pre-visit review of records, face to face time with the patient discussing her conditions and treatments and post visit orders.    Jaynie Collins, MD, FACOG Obstetrician & Gynecologist, [facility] for [employer], [facility]

## 2022-06-02 NOTE — Patient Instructions (Signed)
Take Ibuprofen 800 mg and Tylenol 1000 mg  prior to next appointment   Cervical cryotherapy is a procedure which involves freezing an area of abnormal tissue on the cervix. This tissue gradually disappears and the cervix heals. One cervical cryotherapy is usually sufficient to destroy the abnormal tissue.  Purpose  Cervical cryotherapy is a standard method used to treat cervical dysplasia, meaning the removal of abnormal cell tissue on the cervix.  Also used to treat cervical ectropion. Description  Cervical cryotherapy, or freezing, usually lasts about five minutes and causes a slight amount of discomfort. The procedure is usually performed in an outpatient setting.  Cervical cryotherapy is done by placing a small freeze-probe (cryoprobe) against the cervix that cools the cervix to sub-zero temperatures. The cells destroyed by freezing are shed afterwards in a heavy watery discharge. The main advantage of cryotherapy is that it is a simple procedure that requires inexpensive equipment.  The cryogenic device consists of a gas tank containing a refrigerant and non-explosive, non-toxic gas (usually nitrous oxide). The gas is delivered using flexible tubing through a gun-type attachment to the cryoprobe.  Diagnosis/Preparation  Preparation for cervical cryotherapy involves scheduling the procedure when the patient is not experiencing heavy menstrual flow. Ibuprofen or naproxen sodium may be given before cryotherapy to decrease cramping. If there is any doubt about the pregnancy status, a pregnancy test is performed.  Aftercare  Cervical cryotherapy is often followed by a heavy and often odorous discharge during the first month after the procedure. The discharge is due to the dead tissue cells leaving the treatment site. The patient should abstain from sexual intercourse and not use tampons for a period of two weeks after the procedure. Excessive exercise should also be avoided to lessen the occurrence of  post-therapy bleeding.  Risks  The following risks have been associated with cervical cryotherapy:  Uterine cramping. Often occurs during the cryotherapy but rapidly subsides after treatment.  Bleeding and infection. Rare, but incidences have been reported.  More difficult Pap smears. Future Pap smears and colposcopy may be more difficult after cryotherapy.  Follow Up Following the procedure, it is considered normal to experience the following:  slight cramping for two to three days  watery discharge requiring several pad changes daily  Bloody or brown discharge, especially 12-16 days after the procedure  Alternatives  Loop electrocautery excision procedure (LEEP). This procedure uses a fine wire loop with an electric current flowing through it to remove the desired area of the cervix. Loop excision is usually done under local anesthesia and causes very little discomfort.

## 2022-06-10 DIAGNOSIS — J029 Acute pharyngitis, unspecified: Secondary | ICD-10-CM | POA: Diagnosis not present

## 2022-06-10 DIAGNOSIS — Z6830 Body mass index (BMI) 30.0-30.9, adult: Secondary | ICD-10-CM | POA: Diagnosis not present

## 2022-07-01 DIAGNOSIS — L508 Other urticaria: Secondary | ICD-10-CM | POA: Diagnosis not present

## 2022-07-05 ENCOUNTER — Encounter: Payer: Self-pay | Admitting: Obstetrics & Gynecology

## 2022-07-05 ENCOUNTER — Ambulatory Visit (INDEPENDENT_AMBULATORY_CARE_PROVIDER_SITE_OTHER): Payer: 59 | Admitting: Obstetrics & Gynecology

## 2022-07-05 ENCOUNTER — Other Ambulatory Visit (HOSPITAL_COMMUNITY)
Admission: RE | Admit: 2022-07-05 | Discharge: 2022-07-05 | Disposition: A | Discharge status: Home / Self Care | Payer: 59 | Source: Ambulatory Visit | Attending: Obstetrics & Gynecology | Admitting: Obstetrics & Gynecology

## 2022-07-05 VITALS — BP 116/81 | HR 91 | Wt 164.0 lb

## 2022-07-05 DIAGNOSIS — Z3202 Encounter for pregnancy test, result negative: Secondary | ICD-10-CM | POA: Diagnosis not present

## 2022-07-05 DIAGNOSIS — Z124 Encounter for screening for malignant neoplasm of cervix: Secondary | ICD-10-CM | POA: Diagnosis not present

## 2022-07-05 DIAGNOSIS — N86 Erosion and ectropion of cervix uteri: Secondary | ICD-10-CM

## 2022-07-05 HISTORY — PX: OTHER SURGICAL HISTORY: SHX169

## 2022-07-05 LAB — POCT URINE PREGNANCY: Preg Test, Ur: NEGATIVE

## 2022-07-05 NOTE — Progress Notes (Signed)
    GYNECOLOGY CLINIC PROCEDURE NOTE  Cryotherapy details  Indication: Very prominent cervical ectropion with abnormal bleeding  The indications for cryotherapy were reviewed with the patient in detail. She was counseled about that efficacy of this procedure, and possible need for excisional procedure in the future if her symptoms persist.  The risks of the procedure where explained in detail and patient was told to expect a copious amount of discharge in the next few weeks. All her questions were answered, and written informed consent was obtained.  RN was present as a Biomedical engineer for entire encounter.  The patient was placed in the dorsal lithotomy position and a vaginal speculum was placed. Her cervix was visualized and patient was noted to have had a very prominent cervical ectropion.  Pap smear sample collected, this resulted in significant bleeding.  A paracervical block was administered using 10 ml of 1% lidocaine.  The largest cryotherapy probe was picked and affixed to cryotherapy apparatus. Then nitrogen gas was then activated, the probe was coated with lubricating jelly and applied to the ectropion area. This was kept in place for 3 minutes. The cryotherapy was then stopped and all instruments were removed from the patient's pelvis; a thawing period of 3 minutes was observed.  A second cycle of cryotherapy was then administered to the cervix for 3 minutes.   The patient tolerated the procedure well without any complications. Routine post procedure instructions were given to the patient.  Will follow up pap smear results.  She will return in 6 weeks for reevaluation. If symptoms continue, she may need another session given the size of her ectropion.   Jaynie Collins, MD, FACOG Obstetrician & Gynecologist, [facility] for [employer], [facility]

## 2022-07-05 NOTE — Patient Instructions (Signed)
Cervical cryotherapy is a procedure which involves freezing an area of tissue on the cervix. This tissue gradually disappears and the cervix heals. One cervical cryotherapy is usually sufficient to destroy the abnormal tissue, but two sessions may be needed.   Aftercare  Cervical cryotherapy is often followed by a heavy and often odorous discharge during the first month after the procedure. The discharge is due to the dead tissue cells leaving the treatment site. The patient should abstain from sexual intercourse and not use tampons for a period of two weeks after the procedure. Excessive exercise should also be avoided to lessen the occurrence of post-therapy bleeding.  t.

## 2022-07-08 LAB — CYTOLOGY - PAP
Comment: NEGATIVE
Diagnosis: NEGATIVE
High risk HPV: NEGATIVE

## 2022-07-19 ENCOUNTER — Encounter: Payer: Self-pay | Admitting: Obstetrics & Gynecology

## 2022-07-20 ENCOUNTER — Encounter: Payer: Self-pay | Admitting: Obstetrics & Gynecology

## 2022-07-29 DIAGNOSIS — L508 Other urticaria: Secondary | ICD-10-CM | POA: Diagnosis not present

## 2022-08-12 ENCOUNTER — Encounter: Payer: Self-pay | Admitting: Obstetrics & Gynecology

## 2022-08-12 ENCOUNTER — Ambulatory Visit (INDEPENDENT_AMBULATORY_CARE_PROVIDER_SITE_OTHER): Payer: 59 | Admitting: Obstetrics & Gynecology

## 2022-08-12 VITALS — BP 119/86 | HR 79 | Wt 164.0 lb

## 2022-08-12 DIAGNOSIS — Z9889 Other specified postprocedural states: Secondary | ICD-10-CM | POA: Diagnosis not present

## 2022-08-12 NOTE — Progress Notes (Signed)
    GYNECOLOGY OFFICE ENCOUNTER NOTE  History:  27 y.o. G0P0000 here today for today for cervical check after cyrotherapy on 07/05/22 for cervical ectropion.  Has some bleeding after procedure for 2-3 weeks, but nothing since. Had intercourse once, no bleeding.    The following portions of the patient's history were reviewed and updated as appropriate: allergies, current medications, past family history, past medical history, past social history, past surgical history and problem list. Last pap smear on 07/05/22 was normal, negative HRHPV.  Review of Systems:  Pertinent items are noted in HPI.   Objective:  Physical Exam Blood pressure 119/86, pulse 79, weight 164 lb (74.4 kg). CONSTITUTIONAL: Well-developed, well-nourished female in no acute distress.  NEUROLOGIC: Alert and oriented to person, place, and time. Normal reflexes, muscle tone coordination.  PSYCHIATRIC: Normal mood and affect. Normal behavior. Normal judgment and thought content. CARDIOVASCULAR: Normal heart rate noted RESPIRATORY: Effort and breath sounds normal, no problems with respiration noted ABDOMEN: Soft, no distention noted.   PELVIC: Normal appearing external genitalia; normal appearing vaginal mucosa and cervix. Ectropion is less prominent, with superficial 'scar' tissue noted on surface. Not friable appearing, compared to appearance prior to procedure. Done in the presence of a chaperone.   Assessment & Plan:  1. S/P cryotherapy She is doing well. Advised to return in bleeding recurs, may need another session.  No other concerns.  I spent 15 minutes dedicated to the care of this patient including pre-visit review of records, face to face time with the patient discussing her conditions and treatments and post visit ordering of testing.    Jaynie Collins, MD, FACOG Obstetrician & Gynecologist, [facility] for [employer], [facility]

## 2022-09-02 DIAGNOSIS — L508 Other urticaria: Secondary | ICD-10-CM | POA: Diagnosis not present

## 2022-09-27 DIAGNOSIS — L508 Other urticaria: Secondary | ICD-10-CM | POA: Diagnosis not present

## 2022-11-25 ENCOUNTER — Encounter: Payer: Self-pay | Admitting: Primary Care

## 2022-11-25 ENCOUNTER — Ambulatory Visit: Payer: 59 | Admitting: Obstetrics and Gynecology

## 2022-11-25 ENCOUNTER — Ambulatory Visit (INDEPENDENT_AMBULATORY_CARE_PROVIDER_SITE_OTHER): Payer: 59 | Admitting: Primary Care

## 2022-11-25 VITALS — BP 98/66 | HR 97 | Temp 97.7°F | Ht 62.75 in | Wt 164.0 lb

## 2022-11-25 DIAGNOSIS — R5383 Other fatigue: Secondary | ICD-10-CM

## 2022-11-25 DIAGNOSIS — K219 Gastro-esophageal reflux disease without esophagitis: Secondary | ICD-10-CM | POA: Diagnosis not present

## 2022-11-25 DIAGNOSIS — R7989 Other specified abnormal findings of blood chemistry: Secondary | ICD-10-CM

## 2022-11-25 DIAGNOSIS — Z0000 Encounter for general adult medical examination without abnormal findings: Secondary | ICD-10-CM | POA: Diagnosis not present

## 2022-11-25 DIAGNOSIS — E785 Hyperlipidemia, unspecified: Secondary | ICD-10-CM

## 2022-11-25 DIAGNOSIS — F411 Generalized anxiety disorder: Secondary | ICD-10-CM

## 2022-11-25 DIAGNOSIS — L509 Urticaria, unspecified: Secondary | ICD-10-CM | POA: Insufficient documentation

## 2022-11-25 DIAGNOSIS — N921 Excessive and frequent menstruation with irregular cycle: Secondary | ICD-10-CM

## 2022-11-25 DIAGNOSIS — R519 Headache, unspecified: Secondary | ICD-10-CM

## 2022-11-25 DIAGNOSIS — N946 Dysmenorrhea, unspecified: Secondary | ICD-10-CM

## 2022-11-25 LAB — LIPID PANEL
Cholesterol: 189 mg/dL (ref 0–200)
HDL: 50.1 mg/dL (ref >39.00)
LDL Cholesterol: 121 mg/dL — ABNORMAL HIGH (ref 0–99)
NonHDL: 139.22
Total CHOL/HDL Ratio: 4
Triglycerides: 89 mg/dL (ref 0.0–149.0)
VLDL: 17.8 mg/dL (ref 0.0–40.0)

## 2022-11-25 LAB — COMPREHENSIVE METABOLIC PANEL
ALT: 11 U/L (ref 0–35)
AST: 12 U/L (ref 0–37)
Albumin: 3.8 g/dL (ref 3.5–5.2)
Alkaline Phosphatase: 81 U/L (ref 39–117)
BUN: 8 mg/dL (ref 6–23)
CO2: 26 meq/L (ref 19–32)
Calcium: 9 mg/dL (ref 8.4–10.5)
Chloride: 105 meq/L (ref 96–112)
Creatinine, Ser: 0.58 mg/dL (ref 0.40–1.20)
GFR: 124.17 mL/min (ref >60.00)
Glucose, Bld: 89 mg/dL (ref 70–99)
Potassium: 4.2 meq/L (ref 3.5–5.1)
Sodium: 138 meq/L (ref 135–145)
Total Bilirubin: 0.5 mg/dL (ref 0.2–1.2)
Total Protein: 6 g/dL (ref 6.0–8.3)

## 2022-11-25 LAB — CBC
HCT: 42.3 % (ref 36.0–46.0)
Hemoglobin: 14 g/dL (ref 12.0–15.0)
MCHC: 33 g/dL (ref 30.0–36.0)
MCV: 88.9 fL (ref 78.0–100.0)
Platelets: 246 10*3/uL (ref 150.0–400.0)
RBC: 4.76 Mil/uL (ref 3.87–5.11)
RDW: 12.9 % (ref 11.5–15.5)
WBC: 6.6 10*3/uL (ref 4.0–10.5)

## 2022-11-25 LAB — IBC + FERRITIN
Ferritin: 29.7 ng/mL (ref 10.0–291.0)
Iron: 117 ug/dL (ref 42–145)
Saturation Ratios: 32.9 % (ref 20.0–50.0)
TIBC: 355.6 ug/dL (ref 250.0–450.0)
Transferrin: 254 mg/dL (ref 212.0–360.0)

## 2022-11-25 LAB — T4, FREE: Free T4: 0.59 ng/dL — ABNORMAL LOW (ref 0.60–1.60)

## 2022-11-25 LAB — TSH: TSH: 1.87 u[IU]/mL (ref 0.35–5.50)

## 2022-11-25 MED ORDER — ESCITALOPRAM OXALATE 10 MG PO TABS: ORAL_TABLET | ORAL | 3 refills | Status: DC

## 2022-11-25 NOTE — Assessment & Plan Note (Signed)
Stable.  Continue Excedrin Migraine PRN.

## 2022-11-25 NOTE — Assessment & Plan Note (Signed)
Repeat TSH and Free T4 pending.

## 2022-11-25 NOTE — Progress Notes (Signed)
Subjective:    Patient ID: Jaclyn Bell, female    DOB: 1995-10-15, 27 y.o.   MRN: [id number]  HPI  Jaclyn Bell is a very pleasant 27 y.o. female who presents today for complete physical and follow up of chronic conditions.  She has noticed feeling more fatigued since her cervical cryotherapy treatment per GYN. She has a history of iron deficiency anemia and questions if she is anemic again. She's found that she can take a 2 hour nap during the day, then sleep well during the night.   Immunizations: -Tetanus: Completed in 2018 -Influenza: Will get at work   Diet: Fair diet.  Exercise: No regular exercise.  Eye exam: Completes annually  Dental exam: Completes semi-annually    Pap Smear: Completed in 2024, follows with GYN   BP Readings from Last 3 Encounters:  11/25/22 98/66  08/12/22 119/86  07/05/22 116/81      Review of Systems  Constitutional:  Negative for unexpected weight change.  HENT:  Negative for rhinorrhea.   Respiratory:  Negative for cough and shortness of breath.   Cardiovascular:  Negative for chest pain.  Gastrointestinal:  Positive for constipation. Negative for diarrhea.  Genitourinary:  Negative for difficulty urinating and menstrual problem.  Musculoskeletal:  Negative for arthralgias and myalgias.  Skin:  Negative for rash.  Allergic/Immunologic: Positive for environmental allergies and food allergies.  Neurological:  Negative for dizziness, numbness and headaches.  Psychiatric/Behavioral:  The patient is not nervous/anxious.          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   Culture positive   Hives    Persistent cough 03/05/2021   Rash and nonspecific skin eruption 08/01/2020    Social History   Socioeconomic History   Marital status: Single    Spouse name: Not on file   Number of children: Not on file   Years of education: Not on file   Highest  education level: Associate degree: occupational, Scientist, product/process development, or vocational program  Occupational History   Not on file  Tobacco Use   Smoking status: Never   Smokeless tobacco: Never  Vaping Use   Vaping status: Never Used  Substance and Sexual Activity   Alcohol use: Yes    Comment: occ   Drug use: No   Sexual activity: Yes    Birth control/protection: Pill  Other Topics Concern   Not on file  Social History Narrative   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   Lives with her mother and younger sister.   Enjoys shopping.   Social Determinants of Health   Financial Resource Strain: Low Risk  (11/24/2022)   Overall Financial Resource Strain (CARDIA)    Difficulty of Paying Living Expenses: Not very hard  Food Insecurity: No Food Insecurity (11/24/2022)   Hunger Vital Sign    Worried About Running Out of Food in the Last Year: Never true    Ran Out of Food in the Last Year: Never true  Transportation Needs: No Transportation Needs (11/24/2022)   PRAPARE - Administrator, Civil Service (Medical): No    Lack of Transportation (Non-Medical): No  Physical Activity: Unknown (11/24/2022)   Exercise Vital Sign    Days of Exercise per Week: 0 days    Minutes of Exercise per Session: Not on file  Stress: No Stress Concern Present (11/24/2022)   [employer] of Occupational Health -  Occupational Stress Questionnaire    Feeling of Stress : Only a little  Social Connections: Socially Isolated (11/24/2022)   Social Connection and Isolation Panel [NHANES]    Frequency of Communication with Friends and Family: More than three times a week    Frequency of Social Gatherings with Friends and Family: Once a week    Attends Religious Services: Never    Database administrator or Organizations: No    Attends Engineer, structural: Not on file    Marital Status: Never married  Intimate Partner Violence: Not on file    Past Surgical History:  Procedure  Laterality Date   Cervical cryotherapy  07/05/2022   FOOT SURGERY     pin in place   TONSILLECTOMY AND ADENOIDECTOMY     TUBES IN EARS Bilateral     Family History  Problem Relation Age of Onset   Hypertension Mother    Mental illness Father    Cancer Maternal Grandmother     No Known Allergies  Current Outpatient Medications on File Prior to Visit  Medication Sig Dispense Refill   albuterol (VENTOLIN HFA) 108 (90 Base) MCG/ACT inhaler Inhale 2 puffs into the lungs every 4 (four) hours as needed for shortness of breath. 1 each 0   norgestimate-ethinyl estradiol (ESTARYLLA) 0.25-35 MG-MCG tablet TAKE 1 TABLET BY MOUTH EVERY DAY. ACTIVE TABLETS ONLY AS DIRECTED 84 tablet 4   Omalizumab (XOLAIR [place]) Inject into the skin every 30 (thirty) days.     clotrimazole (LOTRIMIN) 1 % cream Apply topically 2 (two) times daily.     sulfacetamide (BLEPH-10) 10 % ophthalmic ointment every 6 (six) hours.     No current facility-administered medications on file prior to visit.    BP 98/66   Pulse 97   Temp 97.7 F (36.5 C) (Temporal)   Ht 5' 2.75" (1.594 m)   Wt 164 lb (74.4 kg)   SpO2 98%   BMI 29.28 kg/m  Objective:   Physical Exam HENT:     Right Ear: Tympanic membrane and ear canal normal.     Left Ear: Tympanic membrane and ear canal normal.  Eyes:     Pupils: Pupils are equal, round, and reactive to light.  Cardiovascular:     Rate and Rhythm: Normal rate and regular rhythm.  Pulmonary:     Effort: Pulmonary effort is normal.     Breath sounds: Normal breath sounds.  Abdominal:     General: Bowel sounds are normal.     Palpations: Abdomen is soft.     Tenderness: There is no abdominal tenderness.  Musculoskeletal:        General: Normal range of motion.     Cervical back: Neck supple.  Skin:    General: Skin is warm and dry.  Neurological:     Mental Status: She is alert and oriented to person, place, and time.     Cranial Nerves: No cranial nerve deficit.     Deep  Tendon Reflexes:     Reflex Scores:      Patellar reflexes are 2+ on the right side and 2+ on the left side. Psychiatric:        Mood and Affect: Mood normal.           Assessment & Plan:  Gastroesophageal reflux disease, unspecified whether esophagitis present Assessment & Plan: Overall improved.  Continue Tums PRN.   Fatigue, unspecified type Assessment & Plan: Since cryotherapy treatment per GYN.  Checking iron studies today  give prior history.  Await results.   Orders: -     CBC -     IBC + Ferritin -     Comprehensive metabolic panel  Abnormal TSH Assessment & Plan: Repeat TSH and Free T4 pending.  Orders: -     T4, free -     TSH  GAD (generalized anxiety disorder) Assessment & Plan: Controlled.  Continue Lexapro 10 mg daily.  Will send to [facility] mail order pharmacy to take advantage of 90 day supply.  Orders: -     Escitalopram Oxalate; TAKE 1 TABLET(10 MG) BY MOUTH DAILY FOR ANXIETY  Dispense: 90 tablet; Refill: 3  Full body hives Assessment & Plan: Controlled.  Following with dermatology. Continue Xolair injections every 28 days.  Alpha gal and food allergy panel testing pending as she has never been tested.  Orders: -     Alpha-Gal Panel -     Food Allergy Profile  Frequent headaches Assessment & Plan: Stable.  Continue Excedrin Migraine PRN.   Menorrhagia with irregular cycle Assessment & Plan: Controlled.  Continue continuous OCPs per GYN.   Dysmenorrhea Assessment & Plan: Controlled.  Continue continuous OCPs per GYN.   Hyperlipidemia, unspecified hyperlipidemia type -     Lipid panel  Preventative health care Assessment & Plan: Immunizations UTD.  She will obtain flu shot at work. Pap smear UTD.  Follows with GYN  Discussed the importance of a healthy diet and regular exercise in order for weight loss, and to reduce the risk of further co-morbidity.  Exam stable. Labs pending.  Follow up in 1 year for repeat  physical.          Doreene Nest, NP

## 2022-11-25 NOTE — Assessment & Plan Note (Signed)
Immunizations UTD.  She will obtain flu shot at work. Pap smear UTD.  Follows with GYN  Discussed the importance of a healthy diet and regular exercise in order for weight loss, and to reduce the risk of further co-morbidity.  Exam stable. Labs pending.  Follow up in 1 year for repeat physical.

## 2022-11-25 NOTE — Assessment & Plan Note (Addendum)
Controlled.  Following with dermatology. Continue Xolair injections every 28 days.  Alpha gal and food allergy panel testing pending as she has never been tested.

## 2022-11-25 NOTE — Patient Instructions (Signed)
Stop by the lab prior to leaving today. I will notify you of your results once received.   It was a pleasure to see you today!  

## 2022-11-25 NOTE — Assessment & Plan Note (Signed)
Controlled.  Continue continuous OCPs per GYN.

## 2022-11-25 NOTE — Assessment & Plan Note (Signed)
Controlled.  Continue Lexapro 10 mg daily.  [facility] to take advantage of 90 day supply.

## 2022-11-25 NOTE — Assessment & Plan Note (Signed)
Overall improved.  Continue Tums PRN.

## 2022-11-25 NOTE — Assessment & Plan Note (Signed)
Since cryotherapy treatment per GYN.  Checking iron studies today give prior history.  Await results.

## 2022-11-26 DIAGNOSIS — R5383 Other fatigue: Secondary | ICD-10-CM

## 2022-11-29 ENCOUNTER — Encounter: Payer: Self-pay | Admitting: *Deleted

## 2022-11-29 LAB — ALPHA-GAL PANEL
Allergen, Mutton, f88: <0.1 kU/L
Allergen, Pork, f26: <0.1 kU/L
Beef: <0.1 kU/L
CLASS: 0
CLASS: 0
Class: 0
GALACTOSE-ALPHA-1,3-GALACTOSE IGE*: <0.1 kU/L (ref <0.10)

## 2022-11-29 LAB — FOOD ALLERGY PROFILE
Allergen, Salmon, f41: <0.1 kU/L
Almonds: <0.1 kU/L
CLASS: 0
CLASS: 0
CLASS: 0
CLASS: 0
CLASS: 0
CLASS: 0
CLASS: 0
CLASS: 0
CLASS: 0
CLASS: 0
CLASS: 0
Cashew IgE: <0.1 kU/L
Class: 0
Class: 0
Class: 0
Class: 0
Egg White IgE: <0.1 kU/L
Fish Cod: <0.1 kU/L
Hazelnut: <0.1 kU/L
Milk IgE: <0.1 kU/L
Peanut IgE: <0.1 kU/L
Scallop IgE: <0.1 kU/L
Sesame Seed f10: <0.1 kU/L
Shrimp IgE: <0.1 kU/L
Soybean IgE: <0.1 kU/L
Tuna IgE: <0.1 kU/L
Walnut: <0.1 kU/L
Wheat IgE: <0.1 kU/L

## 2022-11-29 LAB — INTERPRETATION:

## 2022-12-16 ENCOUNTER — Ambulatory Visit (INDEPENDENT_AMBULATORY_CARE_PROVIDER_SITE_OTHER): Payer: 59 | Admitting: Obstetrics and Gynecology

## 2022-12-16 ENCOUNTER — Encounter: Payer: Self-pay | Admitting: Obstetrics and Gynecology

## 2022-12-16 VITALS — BP 120/78 | HR 91 | Wt 167.0 lb

## 2022-12-16 DIAGNOSIS — Z1339 Encounter for screening examination for other mental health and behavioral disorders: Secondary | ICD-10-CM

## 2022-12-16 DIAGNOSIS — L508 Other urticaria: Secondary | ICD-10-CM | POA: Diagnosis not present

## 2022-12-16 DIAGNOSIS — Z01419 Encounter for gynecological examination (general) (routine) without abnormal findings: Secondary | ICD-10-CM

## 2022-12-16 MED ORDER — FOLIC ACID 1 MG PO TABS: 1.0000 mg | ORAL_TABLET | Freq: Every day | ORAL | Status: DC

## 2022-12-16 NOTE — Addendum Note (Signed)
Addended by: Diamond Bing on: 12/16/2022 01:50 PM   Modules accepted: Orders

## 2022-12-16 NOTE — Progress Notes (Signed)
Patient presents for Annual.  Had Cryo in 06/2022. No longer having irregular bleeding or discharge.   LMP: No cycle since Cryo.  Last pap: Date: 07/05/22 Contraception: OCP Mammogram: Not yet indicated STD Screening: Declines   Pt would like  Discuss stopping BC.

## 2022-12-16 NOTE — Progress Notes (Addendum)
Obstetrics and Gynecology New Patient Evaluation  Appointment Date: 12/16/2022  OBGYN Clinic: [facility]   Primary Care Provider: Doreene Nest  Referring Provider: Doreene Nest, NP  Chief Complaint: annual exam  History of Present Illness: Jaclyn Bell is a 27 y.o.  [id number] (No LMP recorded. (Menstrual status: Oral contraceptives).), seen for the above chief complaint. 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 She takes her COCs continuously and hasn't done placebo since the cryo so no period since.   Patient wanting to come off OCPs soon to try and conceive; she's been on them since age 28 for heavy and painful periods  Review of Systems: Pertinent items are noted in HPI.   Patient Active Problem List   Diagnosis Date Noted   Full body hives 11/25/2022   Abnormal TSH 11/19/2021   Gastroesophageal reflux disease 11/19/2021   GAD (generalized anxiety disorder) 08/01/2020   Frequent headaches 11/09/2019   Preventative health care 11/09/2019   Lower extremity numbness 08/16/2018   Dysmenorrhea 11/11/2017   Fatigue 11/11/2017   Acne 10/10/2013   Menorrhagia 12/01/2012    Past Medical History:  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   Culture positive   Hives    Persistent cough 03/05/2021   Rash and nonspecific skin eruption 08/01/2020    Past Surgical History:  Past Surgical History:  Procedure Laterality Date   Cervical cryotherapy  07/05/2022   FOOT SURGERY     pin in place   TONSILLECTOMY AND ADENOIDECTOMY     TUBES IN EARS Bilateral     Past Obstetrical History:  OB History  Gravida Para Term Preterm AB Living  0 0 0 0 0 0  SAB IAB Ectopic Multiple Live Births  0 0 0 0 0    Past Gynecological History: As  per HPI. History of Pap Smear(s): Yes.   Last pap 2024, which was wnl  Social History:  Social History   Socioeconomic History   Marital status: Single    Spouse name: Not on file   Number of children: Not on file   Years of education: Not on file   Highest education level: Associate degree: occupational, Scientist, product/process development, or vocational program  Occupational History   Not on file  Tobacco Use   Smoking status: Never   Smokeless tobacco: Never  Vaping Use   Vaping status: Never Used  Substance and Sexual Activity   Alcohol use: Yes    Comment: occ   Drug use: No   Sexual activity: Yes    Birth control/protection: Pill  Other Topics Concern   Not on file  Social History Narrative   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   Lives with her mother and younger sister.   Enjoys shopping.   Social Determinants of Health   Financial Resource Strain: Low Risk  (11/24/2022)   Overall Financial Resource Strain (CARDIA)    Difficulty of Paying Living Expenses: Not very hard  Food Insecurity: No Food Insecurity (11/24/2022)   Hunger Vital Sign    Worried About Running Out of Food in the Last Year: Never true    Ran Out of Food in the Last Year: Never true  Transportation Needs: No Transportation Needs (11/24/2022)   PRAPARE -  Administrator, Civil Service (Medical): No    Lack of Transportation (Non-Medical): No  Physical Activity: Unknown (11/24/2022)   Exercise Vital Sign    Days of Exercise per Week: 0 days    Minutes of Exercise per Session: Not on file  Stress: No Stress Concern Present (11/24/2022)   [employer] of Occupational Health - Occupational Stress Questionnaire    Feeling of Stress : Only a little  Social Connections: Socially Isolated (11/24/2022)   Social Connection and Isolation Panel [NHANES]    Frequency of Communication with Friends and Family: More than three times a week    Frequency of Social Gatherings with Friends and Family:  Once a week    Attends Religious Services: Never    Database administrator or Organizations: No    Attends Engineer, structural: Not on file    Marital Status: Never married  Catering manager Violence: Not on file    Family History:  Family History  Problem Relation Age of Onset   Hypertension Mother    Mental illness Father    Cancer Maternal Grandmother     Medications Nijah H. Crickenberger had no medications administered during this visit. Current Outpatient Medications  Medication Sig Dispense Refill   albuterol (VENTOLIN HFA) 108 (90 Base) MCG/ACT inhaler Inhale 2 puffs into the lungs every 4 (four) hours as needed for shortness of breath. 1 each 0   escitalopram (LEXAPRO) 10 MG tablet TAKE 1 TABLET(10 MG) BY MOUTH DAILY FOR ANXIETY 90 tablet 3   folic acid (FOLVITE) 1 MG tablet Take 1 tablet (1 mg total) by mouth daily.     Omalizumab (XOLAIR [place]) Inject into the skin every 30 (thirty) days.     clotrimazole (LOTRIMIN) 1 % cream Apply topically 2 (two) times daily.     norgestimate-ethinyl estradiol (ESTARYLLA) 0.25-35 MG-MCG tablet TAKE 1 TABLET BY MOUTH EVERY DAY. ACTIVE TABLETS ONLY AS DIRECTED 84 tablet 4   sulfacetamide (BLEPH-10) 10 % ophthalmic ointment every 6 (six) hours.     No current facility-administered medications for this visit.    Allergies Patient has no known allergies.   Physical Exam:  BP 120/78   Pulse 91   Wt 167 lb (75.8 kg)   BMI 29.82 kg/m  Body mass index is 29.82 kg/m. General appearance: Well nourished, well developed female in no acute distress.  Neck:  Supple, normal appearance, and no thyromegaly  Cardiovascular: normal s1 and s2.  No murmurs, rubs or gallops. Respiratory:  Clear to auscultation bilateral. Normal respiratory effort Abdomen: positive bowel sounds and no masses, hernias; diffusely non tender to palpation, non distended Breasts: breasts appear normal, no suspicious masses, no skin or nipple changes or axillary nodes,  and normal palpation. Neuro/Psych:  Normal mood and affect.  Skin:  Warm and dry.  Lymphatic:  No inguinal lymphadenopathy.   Cervical exam performed in the presence of a chaperone Pelvic exam: is not limited by body habitus EGBUS: within normal limits Vagina: within normal limits and with no blood or discharge in the vault Cervix: normal appearing cervix without tenderness, discharge or lesions. Os patent with cytobrush test; cervix not friable at all. Small ectropion from 6 to 9 o'clock Uterus:  nonenlarged and non tender Adnexa:  no mass, fullness, tenderness Rectovaginal: deferred  Laboratory: none  Radiology: none  Assessment: patient doing well  Plan:  1. Well woman exam with routine gynecological exam Routine care. D/w her re: stopping ocps and should expect period  soon thereafter. Recommend she start folic acid/multi-vitamin a month or so prior to trying to conceive. D/w xolair category B based on observational studies but if can, always best to be on as little medications as possible when trying to conceive/early pregnancy.   RTC PRN  Cornelia Copa MD Attending [facility])

## 2023-01-08 DIAGNOSIS — J4520 Mild intermittent asthma, uncomplicated: Secondary | ICD-10-CM

## 2023-01-09 MED ORDER — ALBUTEROL SULFATE HFA 108 (90 BASE) MCG/ACT IN AERS
2.0000 | INHALATION_SPRAY | RESPIRATORY_TRACT | 0 refills | Status: DC | PRN
Start: 2023-01-09 — End: 2023-11-29

## 2023-01-17 DIAGNOSIS — R946 Abnormal results of thyroid function studies: Secondary | ICD-10-CM | POA: Diagnosis not present

## 2023-01-17 DIAGNOSIS — R7989 Other specified abnormal findings of blood chemistry: Secondary | ICD-10-CM | POA: Diagnosis not present

## 2023-01-31 DIAGNOSIS — Z6830 Body mass index (BMI) 30.0-30.9, adult: Secondary | ICD-10-CM | POA: Diagnosis not present

## 2023-01-31 DIAGNOSIS — J069 Acute upper respiratory infection, unspecified: Secondary | ICD-10-CM | POA: Diagnosis not present

## 2023-01-31 DIAGNOSIS — J029 Acute pharyngitis, unspecified: Secondary | ICD-10-CM | POA: Diagnosis not present

## 2023-01-31 DIAGNOSIS — J4540 Moderate persistent asthma, uncomplicated: Secondary | ICD-10-CM | POA: Diagnosis not present

## 2023-01-31 DIAGNOSIS — H9203 Otalgia, bilateral: Secondary | ICD-10-CM | POA: Diagnosis not present

## 2023-06-06 DIAGNOSIS — L508 Other urticaria: Secondary | ICD-10-CM | POA: Diagnosis not present

## 2023-06-09 ENCOUNTER — Ambulatory Visit
Admission: RE | Admit: 2023-06-09 | Discharge: 2023-06-09 | Disposition: A | Discharge status: Home / Self Care | Source: Ambulatory Visit | Attending: Emergency Medicine | Admitting: Emergency Medicine

## 2023-06-09 VITALS — BP 116/81 | HR 85 | Temp 98.8°F | Resp 18

## 2023-06-09 DIAGNOSIS — M542 Cervicalgia: Secondary | ICD-10-CM

## 2023-06-09 DIAGNOSIS — J029 Acute pharyngitis, unspecified: Secondary | ICD-10-CM | POA: Diagnosis not present

## 2023-06-09 DIAGNOSIS — H9203 Otalgia, bilateral: Secondary | ICD-10-CM

## 2023-06-09 LAB — POC COVID19/FLU A&B COMBO
Covid Antigen, POC: NEGATIVE
Influenza A Antigen, POC: NEGATIVE
Influenza B Antigen, POC: NEGATIVE

## 2023-06-09 LAB — POCT RAPID STREP A (OFFICE): Rapid Strep A Screen: NEGATIVE

## 2023-06-09 MED ORDER — IBUPROFEN 600 MG PO TABS: 600.0000 mg | ORAL_TABLET | Freq: Four times a day (QID) | ORAL | 0 refills | Status: DC | PRN

## 2023-06-09 NOTE — ED Provider Notes (Signed)
 Arlander Bellman    CSN: [id number] Arrival date & time: 06/09/23  0806      History   Chief Complaint Chief Complaint  Patient presents with   Sore Throat    Sore throat, ear pain - Entered by patient    HPI Jaclyn Bell is a 28 y.o. female.  Patient presents with 2-day history of sore throat, ear pain, neck pain and stiffness.  No fever, cough, shortness of breath.  No OTC medication taken today.  Patient took ibuprofen , Mucinex, Nasacort  yesterday.  The history is provided by the patient and medical records.    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   Culture positive   Hives    Persistent cough 03/05/2021   Rash and nonspecific skin eruption 08/01/2020    Patient Active Problem List   Diagnosis Date Noted   Full body hives 11/25/2022   Abnormal TSH 11/19/2021   Gastroesophageal reflux disease 11/19/2021   GAD (generalized anxiety disorder) 08/01/2020   Frequent headaches 11/09/2019   Preventative health care 11/09/2019   Lower extremity numbness 08/16/2018   Dysmenorrhea 11/11/2017   Fatigue 11/11/2017   Acne 10/10/2013   Menorrhagia 12/01/2012    Past Surgical History:  Procedure Laterality Date   Cervical cryotherapy  07/05/2022   FOOT SURGERY     pin in place   TONSILLECTOMY AND ADENOIDECTOMY     TUBES IN EARS Bilateral     OB History     Gravida  0   Para  0   Term  0   Preterm  0   AB  0   Living  0      SAB  0   IAB  0   Ectopic  0   Multiple  0   Live Births  0            Home Medications    Prior to Admission medications   Medication Sig Start Date End Date Taking? Authorizing Provider  ibuprofen  (ADVIL ) 600 MG tablet Take 1 tablet (600 mg total) by mouth every 6 (six) hours as needed. 06/09/23  Yes Wellington Half, NP  Omalizumab Anitra Ket [place]) Inject into the skin every 30 (thirty) days.   Yes [provider]  albuterol  (VENTOLIN   HFA) 108 (90 Base) MCG/ACT inhaler Inhale 2 puffs into the lungs every 4 (four) hours as needed for shortness of breath. 01/09/23   Clark, Katherine K, NP  escitalopram  (LEXAPRO ) 10 MG tablet TAKE 1 TABLET(10 MG) BY MOUTH DAILY FOR ANXIETY 11/25/22   Clark, Katherine K, NP  folic acid  (FOLVITE ) 1 MG tablet Take 1 tablet (1 mg total) by mouth daily. Patient not taking: Reported on 06/09/2023 12/16/22   Raynell Caller, MD  norgestimate -ethinyl estradiol  (ESTARYLLA) 0.25-35 MG-MCG tablet TAKE 1 TABLET BY MOUTH EVERY DAY. ACTIVE TABLETS ONLY AS DIRECTED 06/02/22   Anyanwu, Kathrine Paris, MD    Family History Family History  Problem Relation Age of Onset   Hypertension Mother    Mental illness Father    Cancer Maternal Grandmother     Social History Social History   Tobacco Use   Smoking status: Never   Smokeless tobacco: Never  Vaping Use   Vaping status: Never Used  Substance Use Topics   Alcohol use: Yes    Comment: occ   Drug use: No     Allergies   Patient has no known allergies.  Review of Systems Review of Systems  Constitutional:  Negative for chills and fever.  HENT:  Positive for ear pain and sore throat. Negative for trouble swallowing.   Respiratory:  Negative for cough and shortness of breath.   Gastrointestinal:  Negative for diarrhea and vomiting.  Musculoskeletal:  Positive for neck pain and neck stiffness.  Skin:  Negative for color change and rash.     Physical Exam Triage Vital Signs ED Triage Vitals  Encounter Vitals Group     BP      Systolic BP Percentile      Diastolic BP Percentile      Pulse      Resp      Temp      Temp src      SpO2      Weight      Height      Head Circumference      Peak Flow      Pain Score      Pain Loc      Pain Education      Exclude from Growth Chart    No data found.  Updated Vital Signs BP 116/81   Pulse 85   Temp 98.8 F (37.1 C)   Resp 18   SpO2 97%   Visual Acuity Right Eye Distance:   Left  Eye Distance:   Bilateral Distance:    Right Eye Near:   Left Eye Near:    Bilateral Near:     Physical Exam Constitutional:      General: She is not in acute distress. HENT:     Right Ear: Tympanic membrane normal.     Left Ear: Tympanic membrane normal.     Nose: Nose normal.     Mouth/Throat:     Mouth: Mucous membranes are moist.     Pharynx: Oropharynx is clear.  Neck:     Comments: Bilateral muscle tenderness. Cardiovascular:     Rate and Rhythm: Normal rate and regular rhythm.     Heart sounds: Normal heart sounds.  Pulmonary:     Effort: Pulmonary effort is normal. No respiratory distress.     Breath sounds: Normal breath sounds.  Musculoskeletal:     Cervical back: Normal range of motion and neck supple. Tenderness present. No rigidity.  Lymphadenopathy:     Cervical: No cervical adenopathy.  Neurological:     Mental Status: She is alert.      UC Treatments / Results  Labs (all labs ordered are listed, but only abnormal results are displayed) Labs Reviewed  POCT RAPID STREP A (OFFICE)  POC COVID19/FLU A&B COMBO    EKG   Radiology No results found.  Procedures Procedures (including critical care time)  Medications Ordered in UC Medications - No data to display  Initial Impression / Assessment and Plan / UC Course  I have reviewed the triage vital signs and the nursing notes.  Pertinent labs & imaging results that were available during my care of the patient were reviewed by me and considered in my medical decision making (see chart for details).    Pharyngitis, otalgia, neck pain.  Afebrile and vital signs are stable.  No OTC medication taken today.  Rapid strep negative.  Rapid COVID and flu negative.  Discussed symptomatic treatment including ibuprofen  .  Instructed patient to follow-up with her PCP.  ED precautions given.  Patient agrees to plan of care.   Final Clinical Impressions(s) / UC Diagnoses   Final diagnoses:  Viral pharyngitis   Otalgia of both ears  Neck pain, bilateral     Discharge Instructions      The strep, COVID and flu tests are negative.   Take the ibuprofen  as needed for fever or discomfort.    Follow up with your primary care provider tomorrow.  Go to the emergency department if you have worsening symptoms.          ED Prescriptions     Medication Sig Dispense Auth. Provider   ibuprofen  (ADVIL ) 600 MG tablet Take 1 tablet (600 mg total) by mouth every 6 (six) hours as needed. 30 tablet Wellington Half, NP      PDMP not reviewed this encounter.   Wellington Half, NP 06/09/23 [phone]

## 2023-06-09 NOTE — ED Triage Notes (Signed)
 Patient to Urgent Care with complaints of sore throat/ left sided ear pain/ neck pain (reports her entire neck aches, difficult to turn and move). Very painful to swallow. Denies any fevers.  Symptoms started Tuesday.   Meds: Mucinex-DM/ Nasacort / ibuprofen .

## 2023-06-09 NOTE — Discharge Instructions (Addendum)
 The strep, COVID and flu tests are negative.   Take the ibuprofen  as needed for fever or discomfort.    Follow up with your primary care provider tomorrow.  Go to the emergency department if you have worsening symptoms.

## 2023-06-12 DIAGNOSIS — J029 Acute pharyngitis, unspecified: Secondary | ICD-10-CM | POA: Diagnosis not present

## 2023-06-12 DIAGNOSIS — Z681 Body mass index (BMI) 19 or less, adult: Secondary | ICD-10-CM | POA: Diagnosis not present

## 2023-07-12 ENCOUNTER — Other Ambulatory Visit: Payer: Self-pay

## 2023-07-12 DIAGNOSIS — Z3041 Encounter for surveillance of contraceptive pills: Secondary | ICD-10-CM

## 2023-07-12 DIAGNOSIS — L501 Idiopathic urticaria: Secondary | ICD-10-CM | POA: Diagnosis not present

## 2023-07-12 DIAGNOSIS — L309 Dermatitis, unspecified: Secondary | ICD-10-CM | POA: Diagnosis not present

## 2023-07-12 DIAGNOSIS — L508 Other urticaria: Secondary | ICD-10-CM | POA: Diagnosis not present

## 2023-07-12 MED ORDER — NORGESTIMATE-ETH ESTRADIOL 0.25-35 MG-MCG PO TABS: ORAL_TABLET | ORAL | 4 refills | Status: DC

## 2023-07-12 MED ORDER — NORGESTIMATE-ETH ESTRADIOL 0.25-35 MG-MCG PO TABS: ORAL_TABLET | ORAL | 0 refills | Status: DC

## 2023-07-12 NOTE — Progress Notes (Signed)
 Pt called requesting 1 Birth control pack sent to local pharmacy and then regular refills sent to mail order pharmacy.    Completed- TL

## 2023-11-29 ENCOUNTER — Ambulatory Visit (INDEPENDENT_AMBULATORY_CARE_PROVIDER_SITE_OTHER): Admitting: Primary Care

## 2023-11-29 VITALS — BP 122/80 | HR 96 | Temp 97.8°F | Ht 62.75 in | Wt 180.0 lb

## 2023-11-29 DIAGNOSIS — Z23 Encounter for immunization: Secondary | ICD-10-CM | POA: Diagnosis not present

## 2023-11-29 DIAGNOSIS — F411 Generalized anxiety disorder: Secondary | ICD-10-CM

## 2023-11-29 DIAGNOSIS — N921 Excessive and frequent menstruation with irregular cycle: Secondary | ICD-10-CM

## 2023-11-29 DIAGNOSIS — R5383 Other fatigue: Secondary | ICD-10-CM | POA: Diagnosis not present

## 2023-11-29 DIAGNOSIS — R519 Headache, unspecified: Secondary | ICD-10-CM | POA: Diagnosis not present

## 2023-11-29 DIAGNOSIS — L509 Urticaria, unspecified: Secondary | ICD-10-CM | POA: Diagnosis not present

## 2023-11-29 DIAGNOSIS — N946 Dysmenorrhea, unspecified: Secondary | ICD-10-CM

## 2023-11-29 DIAGNOSIS — J4520 Mild intermittent asthma, uncomplicated: Secondary | ICD-10-CM | POA: Diagnosis not present

## 2023-11-29 DIAGNOSIS — Z0000 Encounter for general adult medical examination without abnormal findings: Secondary | ICD-10-CM

## 2023-11-29 MED ORDER — ALBUTEROL SULFATE HFA 108 (90 BASE) MCG/ACT IN AERS: 2.0000 | INHALATION_SPRAY | RESPIRATORY_TRACT | 0 refills | Status: AC | PRN

## 2023-11-29 MED ORDER — ESCITALOPRAM OXALATE 20 MG PO TABS: 20.0000 mg | ORAL_TABLET | Freq: Every day | ORAL | 0 refills | Status: DC

## 2023-11-29 NOTE — Addendum Note (Signed)
 Addended by: HOPE VEVA PARAS on: 11/29/2023 07:47 AM   Modules accepted: Orders

## 2023-11-29 NOTE — Assessment & Plan Note (Signed)
 Immunizations UTD. Influenza vaccine provided today. Pap smear UTD  Discussed the importance of a healthy diet and regular exercise in order for weight loss, and to reduce the risk of further co-morbidity.  Exam stable. Labs pending.  Follow up in 1 year for repeat physical.

## 2023-11-29 NOTE — Assessment & Plan Note (Signed)
 Controlled.  Continue OCPs

## 2023-11-29 NOTE — Assessment & Plan Note (Signed)
 Increased with increased stress.  Working to reduce stress with increasing Lexapro  to 20 mg.

## 2023-11-29 NOTE — Assessment & Plan Note (Signed)
 Resolved.  Continue to monitor.

## 2023-11-29 NOTE — Assessment & Plan Note (Signed)
 Overall controlled.  Refilled albuterol  inhaler for which she uses PRN

## 2023-11-29 NOTE — Assessment & Plan Note (Signed)
 Deteriorated.   Increase Lexapro  to 20 mg daily. She will update if/when ready to reduce to 10 mg.

## 2023-11-29 NOTE — Progress Notes (Signed)
 Subjective:    Patient ID: Jaclyn Bell, female    DOB: 07-Aug-1995, 28 y.o.   MRN: [id number]  Jaclyn Bell is a very pleasant 28 y.o. female who presents today for complete physical and follow up of chronic conditions.  She would like to increase her dose of Lexapro . Over the year she's undergone increased personal stress including personal expenses and several deaths in the family. Symptoms include feeling fatigued, little interest in doing things, feeling overwhelmed, increased headaches. Historically, Lexapro  has worked well.   Immunizations: -Tetanus: Completed in 2018 -Influenza: Due today  -HPV: Completed series  Diet: Fair diet.  Exercise: No regular exercise.  Eye exam: Completes annually  Dental exam: Completes semi-annually    Pap Smear: Completed in 2024 per GYN  BP Readings from Last 3 Encounters:  11/29/23 122/80  06/09/23 116/81  12/16/22 120/78   Wt Readings from Last 3 Encounters:  11/29/23 180 lb (81.6 kg)  12/16/22 167 lb (75.8 kg)  11/25/22 164 lb (74.4 kg)       Review of Systems  Constitutional:  Negative for unexpected weight change.  HENT:  Negative for rhinorrhea.   Respiratory:  Negative for cough and shortness of breath.   Cardiovascular:  Negative for chest pain.  Gastrointestinal:  Negative for constipation and diarrhea.  Genitourinary:  Negative for difficulty urinating and menstrual problem.  Musculoskeletal:  Negative for arthralgias and myalgias.  Skin:  Negative for rash.  Allergic/Immunologic: Negative for environmental allergies.  Neurological:  Negative for dizziness and headaches.  Psychiatric/Behavioral:  The patient is nervous/anxious.          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   Culture positive   Hives    Lower extremity numbness 08/16/2018   Persistent cough 03/05/2021   Rash and nonspecific skin eruption 08/01/2020    Social  History   Socioeconomic History   Marital status: Single    Spouse name: Not on file   Number of children: Not on file   Years of education: Not on file   Highest education level: Associate degree: academic program  Occupational History   Not on file  Tobacco Use   Smoking status: Never   Smokeless tobacco: Never  Vaping Use   Vaping status: Never Used  Substance and Sexual Activity   Alcohol use: Yes    Comment: occ   Drug use: No   Sexual activity: Yes    Birth control/protection: Pill  Other Topics Concern   Not on file  Social History Narrative   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   Lives with her mother and younger sister.   Enjoys shopping.   Social Drivers of Health   Financial Resource Strain: Medium Risk (11/29/2023)   Overall Financial Resource Strain (CARDIA)    Difficulty of Paying Living Expenses: Somewhat hard  Food Insecurity: No Food Insecurity (11/29/2023)   Hunger Vital Sign    Worried About Running Out of Food in the Last Year: Never true    Ran Out of Food in the Last Year: Never true  Transportation Needs: No Transportation Needs (11/29/2023)   PRAPARE - Administrator, Civil Service (Medical): No    Lack of Transportation (Non-Medical): No  Physical Activity: Inactive (11/29/2023)   Exercise Vital Sign    Days of Exercise per Week: 0 days    Minutes of Exercise per Session: Not on  file  Stress: Stress Concern Present (11/29/2023)   [employer] of Occupational Health - Occupational Stress Questionnaire    Feeling of Stress: Rather much  Social Connections: Socially Isolated (11/29/2023)   Social Connection and Isolation Panel    Frequency of Communication with Friends and Family: More than three times a week    Frequency of Social Gatherings with Friends and Family: Once a week    Attends Religious Services: Never    Database administrator or Organizations: No    Attends Engineer, structural:  Not on file    Marital Status: Never married  Intimate Partner Violence: Not on file    Past Surgical History:  Procedure Laterality Date   Cervical cryotherapy  07/05/2022   FOOT SURGERY     pin in place   TONSILLECTOMY AND ADENOIDECTOMY     TUBES IN EARS Bilateral     Family History  Problem Relation Age of Onset   Hypertension Mother    Mental illness Father    Cancer Maternal Grandmother     Allergies  Allergen Reactions   Penicillins Hives    Current Outpatient Medications on File Prior to Visit  Medication Sig Dispense Refill   norgestimate -ethinyl estradiol  (ESTARYLLA) 0.25-35 MG-MCG tablet TAKE 1 TABLET BY MOUTH EVERY DAY. ACTIVE TABLETS ONLY AS DIRECTED 84 tablet 4   ibuprofen  (ADVIL ) 600 MG tablet Take 1 tablet (600 mg total) by mouth every 6 (six) hours as needed. (Patient not taking: Reported on 11/29/2023) 30 tablet 0   No current facility-administered medications on file prior to visit.    BP 122/80   Pulse 96   Temp 97.8 F (36.6 C) (Temporal)   Ht 5' 2.75 (1.594 m)   Wt 180 lb (81.6 kg)   LMP  (LMP Unknown)   SpO2 97%   BMI 32.14 kg/m  Objective:   Physical Exam HENT:     Right Ear: Tympanic membrane and ear canal normal.     Left Ear: Tympanic membrane and ear canal normal.  Eyes:     Pupils: Pupils are equal, round, and reactive to light.  Cardiovascular:     Rate and Rhythm: Normal rate and regular rhythm.  Pulmonary:     Effort: Pulmonary effort is normal.     Breath sounds: Normal breath sounds.  Abdominal:     General: Bowel sounds are normal.     Palpations: Abdomen is soft.     Tenderness: There is no abdominal tenderness.  Musculoskeletal:        General: Normal range of motion.     Cervical back: Neck supple.  Skin:    General: Skin is warm and dry.  Neurological:     Mental Status: She is alert and oriented to person, place, and time.     Cranial Nerves: No cranial nerve deficit.     Deep Tendon Reflexes:     Reflex  Scores:      Patellar reflexes are 2+ on the right side and 2+ on the left side. Psychiatric:        Mood and Affect: Mood normal.     Physical Exam        Assessment & Plan:  Preventative health care Assessment & Plan: Immunizations UTD. Influenza vaccine provided today.  Pap smear UTD.  Discussed the importance of a healthy diet and regular exercise in order for weight loss, and to reduce the risk of further co-morbidity.  Exam stable. Labs pending.  Follow up in  1 year for repeat physical.   Orders: -     Lipid panel -     Comprehensive metabolic panel with GFR -     Hemoglobin A1c -     CBC  Mild intermittent asthma without complication Assessment & Plan: Overall controlled.  Refilled albuterol  inhaler for which she uses PRN  Orders: -     Albuterol  Sulfate HFA; Inhale 2 puffs into the lungs every 4 (four) hours as needed for shortness of breath.  Dispense: 1 each; Refill: 0  Full body hives Assessment & Plan: Resolved.  Continue to monitor.    Dysmenorrhea Assessment & Plan: Controlled.  Continue OCPs.    Menorrhagia with irregular cycle Assessment & Plan: Controlled.  Continue OCPs   GAD (generalized anxiety disorder) Assessment & Plan: Deteriorated.   Increase Lexapro  to 20 mg daily. She will update if/when ready to reduce to 10 mg.  Orders: -     Escitalopram  Oxalate; Take 1 tablet (20 mg total) by mouth daily. For anxiety  Dispense: 90 tablet; Refill: 0  Frequent headaches Assessment & Plan: Increased with increased stress.  Working to reduce stress with increasing Lexapro  to 20 mg.   Fatigue, unspecified type Assessment & Plan: Ongoing.  Could be secondary to stress. Will check labs today.  Orders: -     Iron, TIBC and Ferritin Panel    Assessment and Plan Assessment & Plan         Comer MARLA Gaskins, NP     History of Present Illness

## 2023-11-29 NOTE — Assessment & Plan Note (Signed)
 Ongoing.  Could be secondary to stress. Will check labs today.

## 2023-11-29 NOTE — Patient Instructions (Signed)
 We increased your dose of Lexapro  to 20 mg for anxiety.  Stop by the lab prior to leaving today. I will notify you of your results once received.   It was a pleasure to see you today!

## 2023-11-30 ENCOUNTER — Ambulatory Visit: Payer: Self-pay | Admitting: Primary Care

## 2023-11-30 LAB — COMPREHENSIVE METABOLIC PANEL WITH GFR
ALT: 12 IU/L (ref 0–32)
AST: 18 IU/L (ref 0–40)
Albumin: 3.9 g/dL — ABNORMAL LOW (ref 4.0–5.0)
Alkaline Phosphatase: 95 IU/L (ref 41–116)
BUN/Creatinine Ratio: 14 (ref 9–23)
BUN: 8 mg/dL (ref 6–20)
Bilirubin Total: 0.2 mg/dL (ref 0.0–1.2)
CO2: 19 mmol/L — ABNORMAL LOW (ref 20–29)
Calcium: 8.4 mg/dL — ABNORMAL LOW (ref 8.7–10.2)
Chloride: 105 mmol/L (ref 96–106)
Creatinine, Ser: 0.56 mg/dL — ABNORMAL LOW (ref 0.57–1.00)
Globulin, Total: 2.4 g/dL (ref 1.5–4.5)
Glucose: 87 mg/dL (ref 70–99)
Potassium: 4.5 mmol/L (ref 3.5–5.2)
Sodium: 139 mmol/L (ref 134–144)
Total Protein: 6.3 g/dL (ref 6.0–8.5)
eGFR: 127 mL/min/1.73 (ref >59)

## 2023-11-30 LAB — CBC
Hematocrit: 43.8 % (ref 34.0–46.6)
Hemoglobin: 14.3 g/dL (ref 11.1–15.9)
MCH: 29.9 pg (ref 26.6–33.0)
MCHC: 32.6 g/dL (ref 31.5–35.7)
MCV: 92 fL (ref 79–97)
Platelets: 310 x10E3/uL (ref 150–450)
RBC: 4.78 x10E6/uL (ref 3.77–5.28)
RDW: 12.6 % (ref 11.7–15.4)
WBC: 8.1 x10E3/uL (ref 3.4–10.8)

## 2023-11-30 LAB — HEMOGLOBIN A1C
Est. average glucose Bld gHb Est-mCnc: 103 mg/dL
Hgb A1c MFr Bld: 5.2 % (ref 4.8–5.6)

## 2023-11-30 LAB — LIPID PANEL
Chol/HDL Ratio: 4.1 ratio (ref 0.0–4.4)
Cholesterol, Total: 209 mg/dL — ABNORMAL HIGH (ref 100–199)
HDL: 51 mg/dL (ref >39)
LDL Chol Calc (NIH): 144 mg/dL — ABNORMAL HIGH (ref 0–99)
Triglycerides: 79 mg/dL (ref 0–149)
VLDL Cholesterol Cal: 14 mg/dL (ref 5–40)

## 2023-11-30 LAB — IRON AND TIBC
Iron Saturation: 28 % (ref 15–55)
Iron: 114 ug/dL (ref 27–159)
Total Iron Binding Capacity: 402 ug/dL (ref 250–450)
UIBC: 288 ug/dL (ref 131–425)

## 2023-11-30 LAB — FERRITIN: Ferritin: 29 ng/mL (ref 15–150)

## 2023-12-11 ENCOUNTER — Encounter: Payer: Self-pay | Admitting: Obstetrics and Gynecology

## 2023-12-11 ENCOUNTER — Other Ambulatory Visit: Payer: Self-pay | Admitting: Family Medicine

## 2023-12-11 DIAGNOSIS — Z3041 Encounter for surveillance of contraceptive pills: Secondary | ICD-10-CM

## 2023-12-12 ENCOUNTER — Other Ambulatory Visit: Payer: Self-pay | Admitting: *Deleted

## 2023-12-12 DIAGNOSIS — Z3041 Encounter for surveillance of contraceptive pills: Secondary | ICD-10-CM

## 2023-12-12 MED ORDER — NORGESTIMATE-ETH ESTRADIOL 0.25-35 MG-MCG PO TABS: ORAL_TABLET | ORAL | 0 refills | Status: DC

## 2023-12-22 ENCOUNTER — Ambulatory Visit: Payer: Self-pay

## 2023-12-22 ENCOUNTER — Ambulatory Visit: Admission: RE | Admit: 2023-12-22 | Discharge: 2023-12-22 | Disposition: A | Discharge status: Home / Self Care

## 2023-12-22 VITALS — BP 125/88 | HR 87 | Temp 98.6°F | Resp 19

## 2023-12-22 DIAGNOSIS — J029 Acute pharyngitis, unspecified: Secondary | ICD-10-CM | POA: Diagnosis not present

## 2023-12-22 DIAGNOSIS — R509 Fever, unspecified: Secondary | ICD-10-CM | POA: Diagnosis not present

## 2023-12-22 LAB — POC COVID19/FLU A&B COMBO
Covid Antigen, POC: NEGATIVE
Influenza A Antigen, POC: NEGATIVE
Influenza B Antigen, POC: NEGATIVE

## 2023-12-22 LAB — POCT RAPID STREP A (OFFICE): Rapid Strep A Screen: NEGATIVE

## 2023-12-22 MED ORDER — IBUPROFEN 600 MG PO TABS: 600.0000 mg | ORAL_TABLET | Freq: Four times a day (QID) | ORAL | 0 refills | Status: AC | PRN

## 2023-12-22 MED ORDER — LIDOCAINE VISCOUS HCL 2 % MT SOLN: 15.0000 mL | OROMUCOSAL | 0 refills | Status: DC | PRN

## 2023-12-22 NOTE — ED Provider Notes (Signed)
 [name]    CSN: [id number] Arrival date & time: 12/22/23  1333      History   Chief Complaint Chief Complaint  Patient presents with   Sore Throat    Fever, body aches, chills, severe sore throat, ear pain - Entered by patient    HPI Jaclyn Bell is a 28 y.o. female.  Patient presents with 3-4 day history of fever, body aches, headache, sore throat.  No congestion, wheezing, shortness of breath, vomiting, diarrhea.  She has been treating her symptoms with OTC cold medications; last taken at 0400 this morning.  The history is provided by the patient and medical records.    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   Culture positive   Hives    Lower extremity numbness 08/16/2018   Persistent cough 03/05/2021   Rash and nonspecific skin eruption 08/01/2020    Patient Active Problem List   Diagnosis Date Noted   Mild intermittent asthma without complication 11/29/2023   Full body hives 11/25/2022   Abnormal TSH 11/19/2021   Gastroesophageal reflux disease 11/19/2021   GAD (generalized anxiety disorder) 08/01/2020   Frequent headaches 11/09/2019   Preventative health care 11/09/2019   Dysmenorrhea 11/11/2017   Fatigue 11/11/2017   Acne 10/10/2013   Menorrhagia 12/01/2012    Past Surgical History:  Procedure Laterality Date   Cervical cryotherapy  07/05/2022   FOOT SURGERY     pin in place   TONSILLECTOMY AND ADENOIDECTOMY     TUBES IN EARS Bilateral     OB History     Gravida  0   Para  0   Term  0   Preterm  0   AB  0   Living  0      SAB  0   IAB  0   Ectopic  0   Multiple  0   Live Births  0            Home Medications    Prior to Admission medications   Medication Sig Start Date End Date Taking? Authorizing Provider  escitalopram  (LEXAPRO ) 20 MG tablet Take 1 tablet (20 mg total) by mouth daily. For anxiety 11/29/23  Yes Clark, Katherine K,  NP  ibuprofen  (ADVIL ) 600 MG tablet Take 1 tablet (600 mg total) by mouth every 6 (six) hours as needed. 12/22/23  Yes Corlis Burnard VEAR, NP  lidocaine (XYLOCAINE) 2 % solution Use as directed 15 mLs in the mouth or throat as needed for mouth pain (Gargle and spit out.). 12/22/23  Yes Corlis Burnard VEAR, NP  MAGNESIUM PO Take by mouth.   Yes [provider]  norgestimate -ethinyl estradiol  (ESTARYLLA) 0.25-35 MG-MCG tablet TAKE 1 TABLET BY MOUTH EVERY DAY. ACTIVE TABLETS ONLY AS DIRECTED 12/12/23  Yes Fredirick Glenys RAMAN, MD  albuterol  (VENTOLIN  HFA) 108 (90 Base) MCG/ACT inhaler Inhale 2 puffs into the lungs every 4 (four) hours as needed for shortness of breath. 11/29/23   Gretta Comer POUR, NP    Family History Family History  Problem Relation Age of Onset   Hypertension Mother    Mental illness Father    Cancer Maternal Grandmother     Social History Social History   Tobacco Use   Smoking status: Never   Smokeless tobacco: Never  Vaping Use   Vaping status: Never Used  Substance Use Topics   Alcohol use: Yes    Comment: occ  Drug use: No     Allergies   Penicillins   Review of Systems Review of Systems  Constitutional:  Positive for fever. Negative for chills.  HENT:  Positive for sore throat. Negative for ear pain.   Respiratory:  Negative for cough, shortness of breath and wheezing.   Gastrointestinal:  Negative for diarrhea and vomiting.  Neurological:  Positive for headaches.     Physical Exam Triage Vital Signs ED Triage Vitals  Encounter Vitals Group     BP 12/22/23 1352 125/88     Girls Systolic BP Percentile --      Girls Diastolic BP Percentile --      Boys Systolic BP Percentile --      Boys Diastolic BP Percentile --      Pulse Rate 12/22/23 1352 87     Resp 12/22/23 1352 19     Temp 12/22/23 1352 98.6 F (37 C)     Temp src --      SpO2 12/22/23 1352 98 %     Weight --      Height --      Head Circumference --      Peak Flow --      Pain Score  12/22/23 1347 10     Pain Loc --      Pain Education --      Exclude from Growth Chart --    No data found.  Updated Vital Signs BP 125/88   Pulse 87   Temp 98.6 F (37 C)   Resp 19   LMP  (LMP Unknown)   SpO2 98%   Visual Acuity Right Eye Distance:   Left Eye Distance:   Bilateral Distance:    Right Eye Near:   Left Eye Near:    Bilateral Near:     Physical Exam Constitutional:      General: She is not in acute distress. HENT:     Right Ear: Tympanic membrane normal.     Left Ear: Tympanic membrane normal.     Nose: Nose normal.     Mouth/Throat:     Mouth: Mucous membranes are moist.     Pharynx: Oropharynx is clear.  Cardiovascular:     Rate and Rhythm: Normal rate and regular rhythm.     Heart sounds: Normal heart sounds.  Pulmonary:     Effort: Pulmonary effort is normal. No respiratory distress.     Breath sounds: Normal breath sounds. No wheezing.  Neurological:     Mental Status: She is alert.      UC Treatments / Results  Labs (all labs ordered are listed, but only abnormal results are displayed) Labs Reviewed  POC COVID19/FLU A&B COMBO - Normal  POCT RAPID STREP A (OFFICE) - Normal    EKG   Radiology No results found.  Procedures Procedures (including critical care time)  Medications Ordered in UC Medications - No data to display  Initial Impression / Assessment and Plan / UC Course  I have reviewed the triage vital signs and the nursing notes.  Pertinent labs & imaging results that were available during my care of the patient were reviewed by me and considered in my medical decision making (see chart for details).    Viral pharyngitis, fever.  Afebrile and vital signs are stable.  Lungs are clear and O2 sat is 98% on room air.  Rapid strep negative.  Rapid COVID and flu negative.  Treating today with ibuprofen  and viscous lidocaine to use as  needed for discomfort.  Instructed patient to follow-up with her PCP if she is not improving.   Education provided on pharyngitis and fever.  Patient agrees to plan of care.  Final Clinical Impressions(s) / UC Diagnoses   Final diagnoses:  Viral pharyngitis  Fever, unspecified     Discharge Instructions      The strep, COVID and flu tests are negative.   Take Tylenol or ibuprofen  as needed for fever or discomfort.  Take plain Mucinex as needed for congestion.  Rest and keep yourself hydrated.    Follow-up with your primary care provider if your symptoms are not improving.         ED Prescriptions     Medication Sig Dispense Auth. Provider   ibuprofen  (ADVIL ) 600 MG tablet Take 1 tablet (600 mg total) by mouth every 6 (six) hours as needed. 30 tablet Corlis Burnard DEL, NP   lidocaine (XYLOCAINE) 2 % solution Use as directed 15 mLs in the mouth or throat as needed for mouth pain (Gargle and spit out.). 100 mL Corlis Burnard DEL, NP      PDMP not reviewed this encounter.   Corlis Burnard DEL, NP 12/22/23 [id number]

## 2023-12-22 NOTE — Telephone Encounter (Signed)
 FYI Only or Action Required?: FYI only for provider: Advised UC, patient declined.  Patient was last seen in primary care on 11/29/2023 by Gretta Comer POUR, NP.  Called Nurse Triage reporting Sore Throat and Fever.  Symptoms began a week ago.  Interventions attempted: OTC medications: Tylenol, Ibuprofen , Theraflu and Rest, hydration, or home remedies.  Symptoms are: stable.  Triage Disposition: See Physician Within 24 Hours  Patient/caregiver understands and will follow disposition?: No Reason for Disposition  Earache also present  Answer Assessment - Initial Assessment Questions Patient has taken Tylenol, Ibuprofen , Theraflu. Patient requesting to be seen today, advised UC as there are no available appointments in PCP office. Patients mother in the background stated Comer told us  to call and she would let her come in to be seen. Called [facility], spoke with Jenna, also advised UC as PCP has a full schedule and patient cannot be squeezed in anywhere. Patient stated she does not want to go to UC as immune system is already down, does not want to catch anything else. Offered virtual UC, patient declined as she is looking to be tested for Covid/Flu. Patient stated she will figure something else out other than UC.   1. ONSET: When did the throat start hurting? (Hours or days ago)      A week ago  2. SEVERITY: How bad is the sore throat? (Scale 1-10; mild, moderate or severe)     Severe, patient stated throat is visibly swollen, denies white spots  3. FEVER: Do you have a fever? If Yes, ask: What is your temperature, how was it measured, and when did it start?     102 yesterday, fever broke but unsure if temp now  4. OTHER SYMPTOMS: Do you have any other symptoms? (e.g., difficulty breathing, headache, rash)     Body aches, earache in both ears, mostly left. Fatigue  Protocols used: Sore Throat-A-AH  Copied from CRM #[id number]. Topic: Clinical - Red Word Triage >> Dec 22, 2023  9:52 AM Donna BRAVO wrote: Red Word that prompted transfer to Nurse Triage: Patient calling asking for a Sypmtoms: -fever 101 to 102 yesterday fever broke around 12/21/23 around 4am -started a week ago -body aches -ear pain -sore burning throat

## 2023-12-22 NOTE — ED Triage Notes (Signed)
 Patient to Urgent Care with complaints of sore throat/ fevers/ body aches/ headaches/ neck pain.  Symptoms started Saturday. Travelled home from [place] . Woke up Tuesday with a 102 fever.   Taking aleve / ibuprofen / theraflu (4am).

## 2023-12-22 NOTE — Discharge Instructions (Addendum)
 The strep, COVID and flu tests are negative.   Take Tylenol or ibuprofen as needed for fever or discomfort.  Take plain Mucinex as needed for congestion.  Rest and keep yourself hydrated.    Follow-up with your primary care provider if your symptoms are not improving.

## 2023-12-22 NOTE — Telephone Encounter (Signed)
 Noted. Agree with nursing triage decision.

## 2023-12-26 ENCOUNTER — Other Ambulatory Visit: Payer: Self-pay | Admitting: *Deleted

## 2023-12-26 DIAGNOSIS — Z3041 Encounter for surveillance of contraceptive pills: Secondary | ICD-10-CM

## 2023-12-26 MED ORDER — NORGESTIMATE-ETH ESTRADIOL 0.25-35 MG-MCG PO TABS: ORAL_TABLET | ORAL | 1 refills | Status: DC

## 2023-12-29 ENCOUNTER — Ambulatory Visit: Admitting: Obstetrics and Gynecology

## 2023-12-29 ENCOUNTER — Other Ambulatory Visit: Payer: Self-pay | Admitting: Obstetrics & Gynecology

## 2023-12-29 DIAGNOSIS — Z3041 Encounter for surveillance of contraceptive pills: Secondary | ICD-10-CM

## 2024-01-23 ENCOUNTER — Ambulatory Visit: Admitting: Advanced Practice Midwife

## 2024-01-23 ENCOUNTER — Encounter: Payer: Self-pay | Admitting: Advanced Practice Midwife

## 2024-01-23 VITALS — BP 123/89 | HR 98 | Wt 178.4 lb

## 2024-01-23 DIAGNOSIS — Z01419 Encounter for gynecological examination (general) (routine) without abnormal findings: Secondary | ICD-10-CM

## 2024-01-23 DIAGNOSIS — A6000 Herpesviral infection of urogenital system, unspecified: Secondary | ICD-10-CM

## 2024-01-23 DIAGNOSIS — Z3041 Encounter for surveillance of contraceptive pills: Secondary | ICD-10-CM

## 2024-01-23 MED ORDER — VALACYCLOVIR HCL 500 MG PO TABS: 500.0000 mg | ORAL_TABLET | Freq: Every day | ORAL | 12 refills | Status: AC | PRN

## 2024-01-23 MED ORDER — NORGESTIMATE-ETH ESTRADIOL 0.25-35 MG-MCG PO TABS: ORAL_TABLET | ORAL | 4 refills | Status: AC

## 2024-01-23 NOTE — Progress Notes (Unsigned)
 GYNECOLOGY OFFICE VISIT NOTE  History:   Jaclyn Bell is a 28 y.o. G0P0000 here today for ***.  Discussed the use of AI scribe software for clinical note transcription with the patient, who gave verbal consent to proceed.  History of Present Illness Jaclyn Bell is a 28 year old female who presents for an annual exam and breast examination.  She uses a continuously cycling birth control pill and feels it is effective. She recently received a 40-month supply and will continue obtaining refills through a local pharmacy once her insurance changes in January.  In November she had an unusually heavy menstrual period on days 2 and 3, lasting about 8-9 days, despite typically having no periods on continuous pills. She relates this to a transition in her pill schedule and stress. She has no current concerns about menstrual periods or vaginal discharge.  She has no breast pain, masses, or other breast concerns. She is not performing self-breast exams. She has no concerns about STIs, was tested last year, and has the same partner.  Her maternal grandmother had breast cancer at age 5. A great uncle had Down syndrome and possible testicular and lymph node cancer.   She denies any abnormal vaginal discharge, bleeding, pelvic pain or other concerns.    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   Culture positive   Hives    Lower extremity numbness 08/16/2018   Persistent cough 03/05/2021   Rash and nonspecific skin eruption 08/01/2020    Past Surgical History:  Procedure Laterality Date   Cervical cryotherapy  07/05/2022   FOOT SURGERY     pin in place   TONSILLECTOMY AND ADENOIDECTOMY     TUBES IN EARS Bilateral     The following portions of the patient's history were reviewed and updated as appropriate: allergies, current medications, past family history, past medical history, past social history, past surgical  history and problem list.   Health Maintenance:  Normal pap and negative HRHPV on ***.  Normal mammogram on ***.   Review of Systems:  Pertinent items noted in HPI and remainder of comprehensive ROS otherwise negative.  Physical Exam:  BP 123/89   Pulse 98   Wt 178 lb 6 oz (80.9 kg)   LMP 01/07/2024 (Exact Date)   BMI 31.85 kg/m  CONSTITUTIONAL: Well-developed, well-nourished female in no acute distress.  HEENT:  Normocephalic, atraumatic. External right and left ear normal. No scleral icterus.  NECK: Normal range of motion, supple, no masses noted on observation SKIN: No rash noted. Not diaphoretic. No erythema. No pallor. MUSCULOSKELETAL: Normal range of motion. No edema noted. NEUROLOGIC: Alert and oriented to person, place, and time. Normal muscle tone coordination. No cranial nerve deficit noted. PSYCHIATRIC: Normal mood and affect. Normal behavior. Normal judgment and thought content. CARDIOVASCULAR: Normal heart rate noted RESPIRATORY: Effort and breath sounds normal, no problems with respiration noted ABDOMEN: No masses or other overt distention noted on observation, no tenderness.   PELVIC: {Blank single:19197::Deferred,Normal appearing external genitalia; normal urethral meatus; normal appearing vaginal mucosa and cervix.  No abnormal discharge noted.  Normal uterine size, no other palpable masses, no uterine or adnexal tenderness. Performed in the presence of a chaperone} Physical Exam GENERAL: Alert, cooperative, well developed, no acute distress. HEENT: Normocephalic, normal oropharynx, moist mucous membranes. NECK: No cervical or supraclavicular lymphadenopathy. CHEST: Clear to auscultation bilaterally, no wheezes, rhonchi, or crackles. CARDIOVASCULAR: Normal heart rate and  rhythm, S1 and S2 normal without murmurs. BREAST: Breasts examined, no abnormalities noted. No lymphadenopathy in axillary region. ABDOMEN: Soft, non-tender, non-distended, without organomegaly,  normal bowel sounds. EXTREMITIES: No cyanosis or edema. NEUROLOGICAL: Cranial nerves grossly intact, moves all extremities without gross motor or sensory deficit.  Labs and Imaging No results found for this or any previous visit (from the past week). No results found.    Assessment and Plan:     Assessment and Plan Assessment & Plan Adult Wellness Visit Annual wellness visit completed. No current menstrual, discharge, or breast issues. Family history noted for breast and testicular cancer. No genetic screening needed. Pap smear and STI screenings up to date. Breast exam normal. Discussed self-breast exam and mammogram recommendations. Colonoscopy to start at age 48 unless family history changes. - Continue routine Pap smears as per guidelines. - Perform self-breast exams regularly. - Monitor family history for any changes in cancer history. - Schedule mammogram at age 48 unless family history changes. - Schedule colonoscopy at age 75 unless family history indicates earlier screening.  Contraceptive management (oral contraceptive pills) Continues effective use of oral contraceptive pills on continuous regimen. Prescription refill issues due to insurance changes. Plans to switch to mail order until insurance allows local pharmacy pickup in January. - Sent prescription for oral contraceptive pills to [facility] for mail order. - Plan for three-month supply with four packs and four refills. - Adjust prescription to local pharmacy after insurance changes in January.     1. Genital herpes simplex type 1 infection (Primary)  2. Well woman exam with routine gynecological exam - norgestimate -ethinyl estradiol  (ESTARYLLA) 0.25-35 MG-MCG tablet; TAKE 1 TABLET BY MOUTH EVERY DAY. ACTIVE TABLETS ONLY AS DIRECTED  Dispense: 112 tablet; Refill: 4  3. Oral contraceptive pill surveillance - norgestimate -ethinyl estradiol  (ESTARYLLA) 0.25-35 MG-MCG tablet; TAKE 1 TABLET BY MOUTH EVERY DAY. ACTIVE  TABLETS ONLY AS DIRECTED  Dispense: 112 tablet; Refill: 4   Routine preventative health maintenance measures emphasized. Please refer to After Visit Summary for other counseling recommendations.   No follow-ups on file.    I spent {5-60:60395} minutes dedicated to the care of this patient including pre-visit review of records, face to face time with the patient discussing her conditions and treatments, post visit ordering of medications and appropriate tests or procedures, coordinating care and documenting this visit encounter.    Bailen Geffre  Claudene HOWARD 01/23/2024 2:59 PM [facility] [employer], [facility]

## 2024-01-26 ENCOUNTER — Encounter: Payer: Self-pay | Admitting: Advanced Practice Midwife

## 2024-02-23 ENCOUNTER — Other Ambulatory Visit: Payer: Self-pay | Admitting: Primary Care

## 2024-02-23 ENCOUNTER — Telehealth: Payer: Self-pay | Admitting: Primary Care

## 2024-02-23 DIAGNOSIS — F411 Generalized anxiety disorder: Secondary | ICD-10-CM

## 2024-02-23 MED ORDER — ESCITALOPRAM OXALATE 20 MG PO TABS: 20.0000 mg | ORAL_TABLET | Freq: Every day | ORAL | 0 refills | Status: AC

## 2024-02-23 NOTE — Telephone Encounter (Signed)
 Fine with me, next available

## 2024-02-23 NOTE — Telephone Encounter (Signed)
 Copied from CRM #[id number]. Topic: Appointments - Transfer of Care >> Feb 23, 2024  9:46 AM Winona R wrote: Pt is requesting to transfer FROM: Gretta Comer POUR, NP Pt is requesting to transfer TO: Antonette Angeline Pouch Reason for requested transfer: Pt not pleased with Care  It is the responsibility of the team the patient would like to transfer to (Dr. Antonette, Angeline Pouch) to reach out to the patient if for any reason this transfer is not acceptable.

## 2024-02-23 NOTE — Telephone Encounter (Signed)
 Copied from CRM #[id number]. Topic: Clinical - Medication Refill >> Feb 23, 2024  9:40 AM Wess RAMAN wrote: Medication: escitalopram  (LEXAPRO ) 20 MG tablet  Has the patient contacted their pharmacy? Yes (Agent: If no, request that the patient contact the pharmacy for the refill. If patient does not wish to contact the pharmacy document the reason why and proceed with request.) (Agent: If yes, when and what did the pharmacy advise?) They did not have prescription on file  This is the patient's preferred pharmacy:   [facility] [place], [place] - [address] [facility] [address] [place] [address] Phone: [phone] Fax: [phone]  Is this the correct pharmacy for this prescription? Yes If no, delete pharmacy and type the correct one.   Has the prescription been filled recently? Yes  Is the patient out of the medication? No  Has the patient been seen for an appointment in the last year OR does the patient have an upcoming appointment? Yes  Can we respond through MyChart? Yes  Agent: Please be advised that Rx refills may take up to 3 business days. We ask that you follow-up with your pharmacy.

## 2024-02-23 NOTE — Telephone Encounter (Signed)
 Fine with me, thanks.

## 2024-05-31 ENCOUNTER — Encounter: Admitting: Internal Medicine
